# Patient Record
Sex: Female | Born: 1995 | Race: White | Hispanic: No | Marital: Married | Smoking: Never smoker
Health system: Southern US, Community
[De-identification: ages and names within clinical notes are randomized; demographics above are authoritative.]

## PROBLEM LIST (undated history)

## (undated) DIAGNOSIS — Q893 Situs inversus: Secondary | ICD-10-CM

## (undated) DIAGNOSIS — T7840XA Allergy, unspecified, initial encounter: Secondary | ICD-10-CM

## (undated) DIAGNOSIS — Z23 Encounter for immunization: Secondary | ICD-10-CM

## (undated) HISTORY — DX: Situs inversus: Q89.3

## (undated) HISTORY — DX: Allergy, unspecified, initial encounter: T78.40XA

## (undated) HISTORY — DX: Encounter for immunization: Z23

## (undated) HISTORY — PX: WISDOM TOOTH EXTRACTION: SHX21

## (undated) HISTORY — PX: TONSILLECTOMY AND ADENOIDECTOMY: SHX28

---
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
## 2016-03-19 IMAGING — US US ABDOMEN COMPLETE
1 series · 14 of 25 positions shown · non-contrast
Comparison: None.

CLINICAL DATA: Abdominal pain and nausea for 3 days.

EXAM:
ULTRASOUND ABDOMEN COMPLETE

[Series 1: us abdomen complete · 0.27mm/px · 14 of 158 slices shown]
[im 1/158]
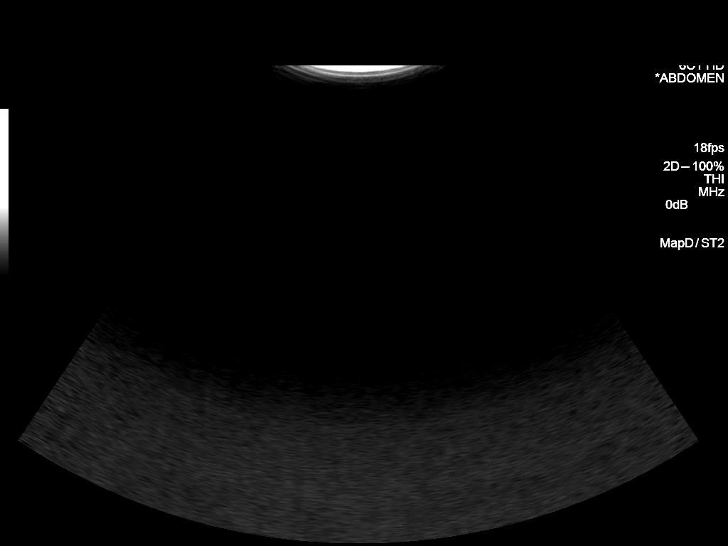
[im 14/158]
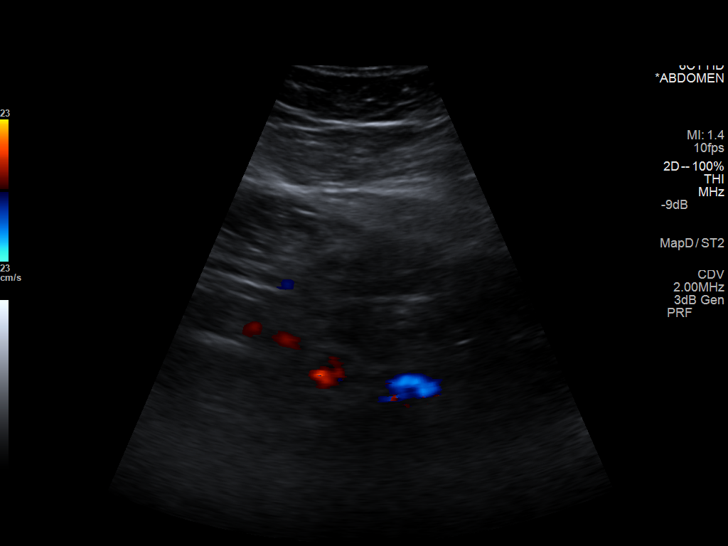
[im 27/158]
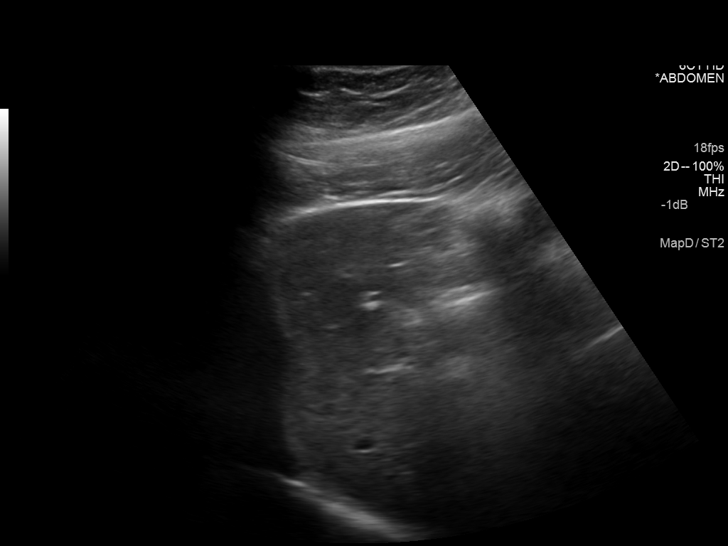
[im 40/158]
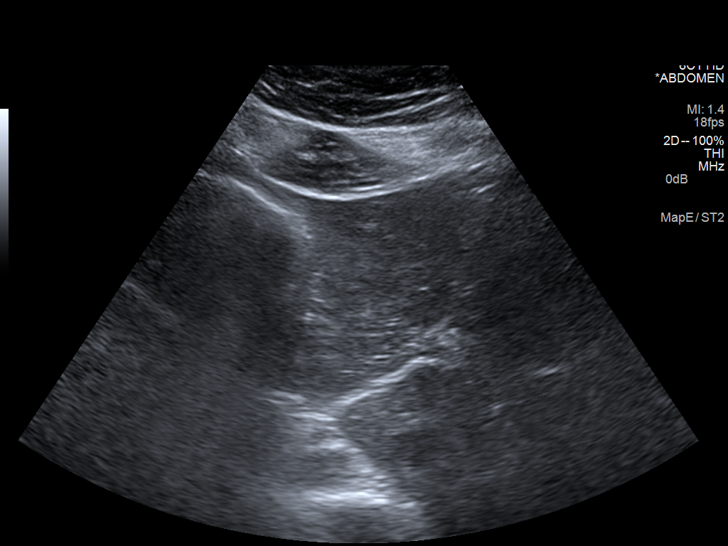
[im 53/158]
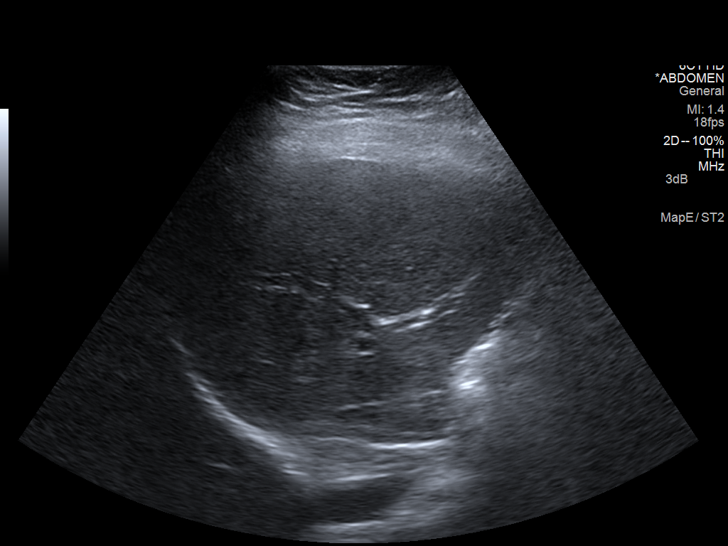
[im 59/158]
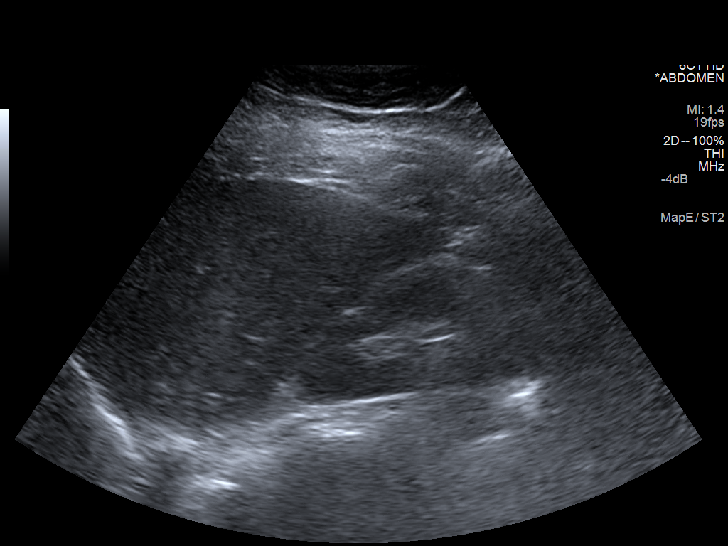
[im 72/158]
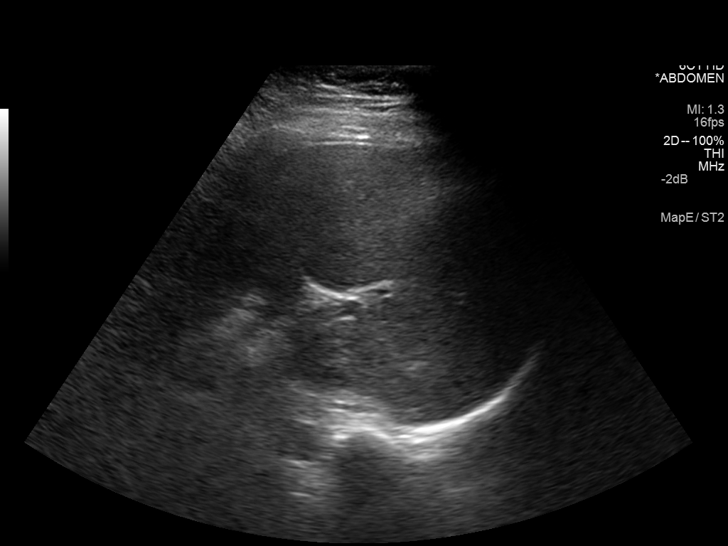
[im 86/158]
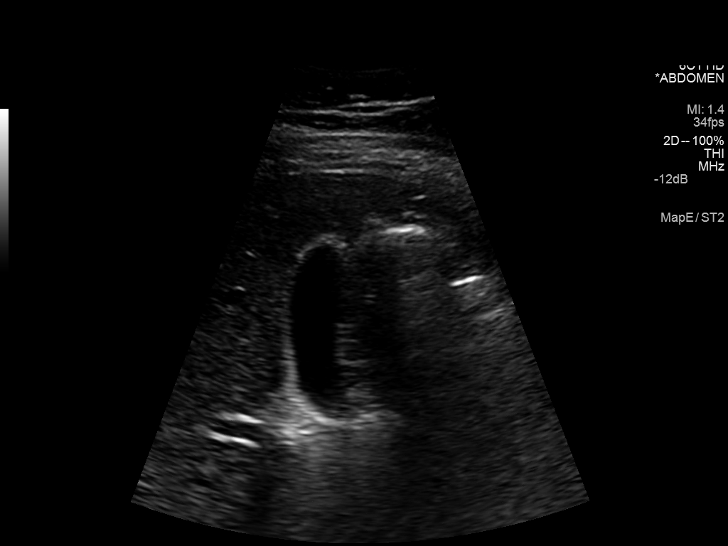
[im 99/158]
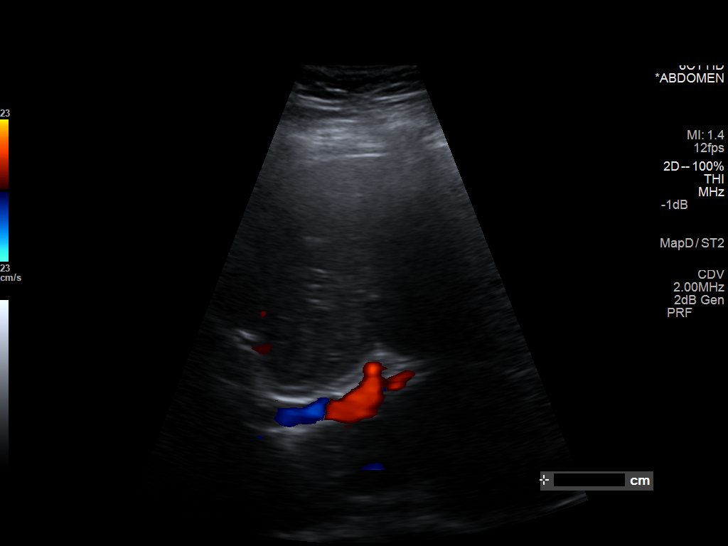
[im 105/158]
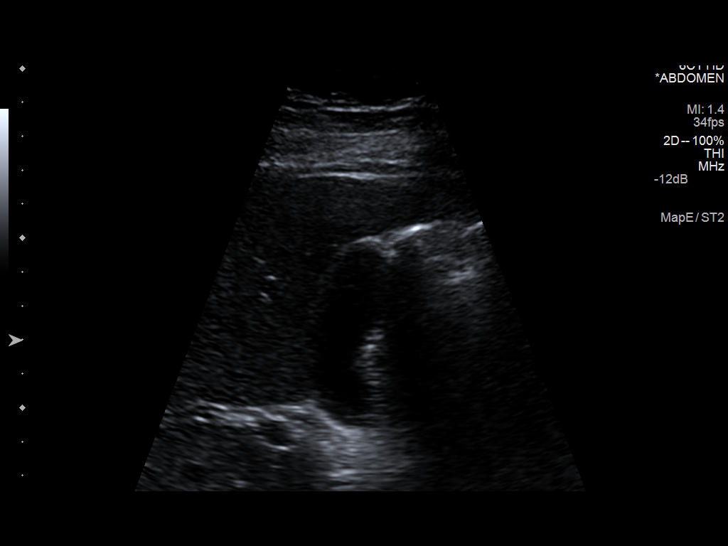
[im 118/158]
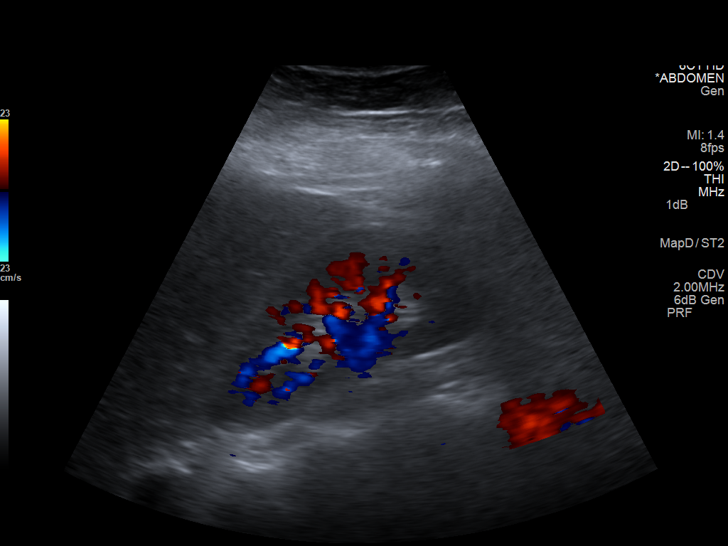
[im 131/158]
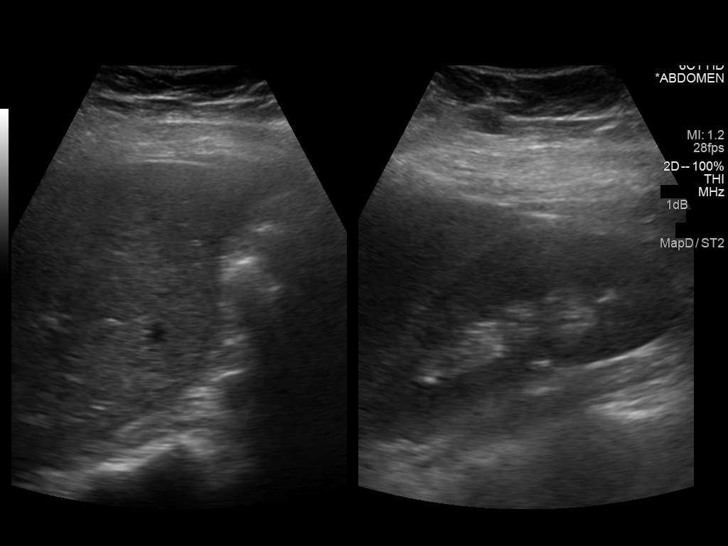
[im 144/158]
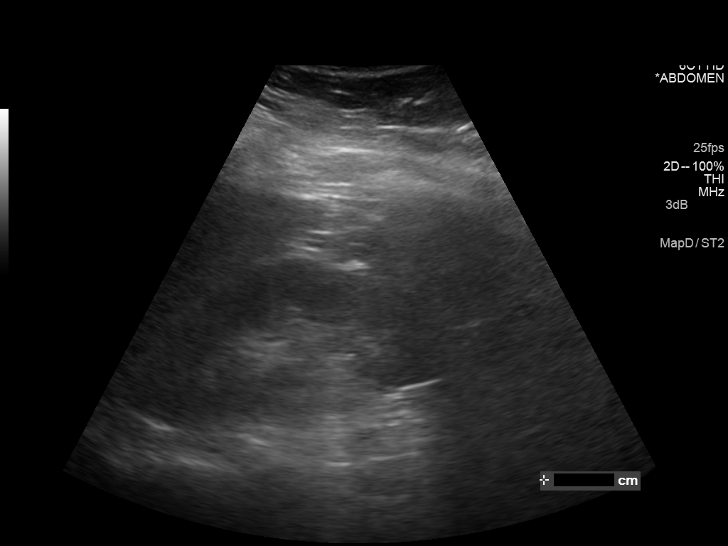
[im 158/158]
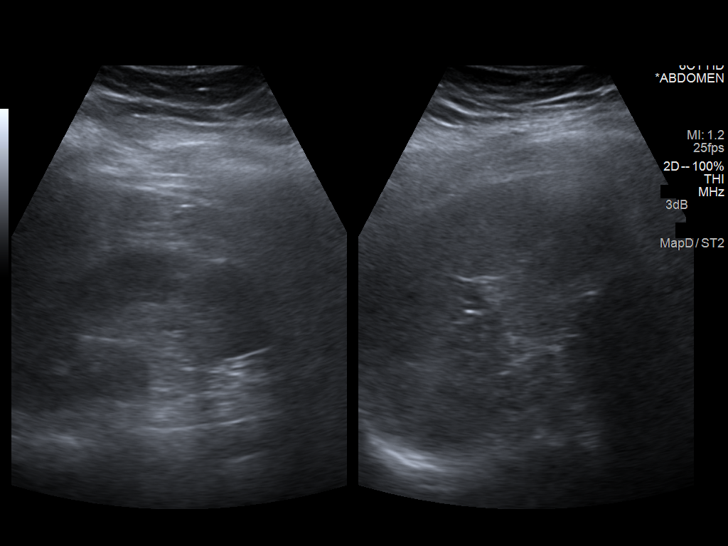

[14 of 25 positions shown; findings below may reference images not displayed]

FINDINGS: Gallbladder: No gallstones or wall thickening visualized. No
sonographic Murphy sign noted.

Common bile duct: Diameter: 2.9 mm

Liver: Normal echogenicity without focal lesion or biliary
dilatation.

IVC: Normal caliber.

Pancreas: Not visualized due to body habitus and poor sonographic
window.

Spleen: Normal size.  No focal lesions.

Right Kidney: Length: 10.2 cm. Normal renal cortical thickness and
echogenicity without focal lesions or hydronephrosis.

Left Kidney: Length: 10.7 cm. Normal renal cortical thickness and
echogenicity without focal lesions or hydronephrosis.

Abdominal aorta: Normal caliber

Other findings: Situs inversus totalis
IMPRESSION: Unremarkable abdominal ultrasound examination. Poor visualization of
the pancreas.

## 2016-04-24 LAB — HM HIV SCREENING LAB: HM HIV Screening: NEGATIVE

## 2018-11-07 ENCOUNTER — Other Ambulatory Visit: Payer: Self-pay

## 2018-11-07 ENCOUNTER — Encounter: Payer: Self-pay | Admitting: Physician Assistant

## 2018-11-07 ENCOUNTER — Ambulatory Visit (LOCAL_COMMUNITY_HEALTH_CENTER): Payer: 59 | Admitting: Physician Assistant

## 2018-11-07 ENCOUNTER — Ambulatory Visit: Payer: Self-pay | Admitting: Obstetrics and Gynecology

## 2018-11-07 VITALS — BP 144/88 | Ht 64.0 in | Wt 273.0 lb

## 2018-11-07 DIAGNOSIS — Z308 Encounter for other contraceptive management: Secondary | ICD-10-CM | POA: Diagnosis not present

## 2018-11-07 DIAGNOSIS — Z3046 Encounter for surveillance of implantable subdermal contraceptive: Secondary | ICD-10-CM | POA: Diagnosis not present

## 2018-11-07 DIAGNOSIS — Z3009 Encounter for other general counseling and advice on contraception: Secondary | ICD-10-CM | POA: Diagnosis not present

## 2018-11-07 NOTE — Progress Notes (Signed)
Pt here for Nexplanon removal as it is due for removal. Pt reports it was placed at [facility] 11/13/15. Pt without issues with Nexplanon but desires to use condoms as BCM at this time. Pt reports using condoms with all sex in the last 7 days.Ronny Bacon, RN

## 2018-11-07 NOTE — Progress Notes (Signed)
[place] problem visit  [place] Department  Subjective:  Katherine Rivera is a 23 y.o. being seen today for  Nexplanon removal.  Chief Complaint  Patient presents with  . Contraception    here for Nexplanon removal    HPI  Patient into clinic for Nexplanon removal.  States that she had placement at [facility] in 2017.  Desires to use condoms as BCM after removal.  Reports that last PE was about 1 year ago and last pap at [facility] was 2018.    Does the patient have a current or past history of drug use? No   No components found for: HCV]   Health Maintenance Due  Topic Date Due  . HIV Screening  06/29/2010  . TETANUS/TDAP  06/29/2014  . PAP-Cervical Cytology Screening  06/28/2016  . PAP SMEAR-Modifier  06/28/2016  . INFLUENZA VACCINE  08/20/2018    Review of Systems  All other systems reviewed and are negative.   The following portions of the patient's history were reviewed and updated as appropriate: allergies, current medications, past family history, past medical history, past social history, past surgical history and problem list. Problem list updated.   See flowsheet for other program required questions.  Objective:   Vitals:   11/07/18 0914  BP: (!) 144/88  Weight: 273 lb (123.8 kg)  Height: 5\' 4"  (1.626 m)    Physical Exam Vitals signs reviewed.  Constitutional:      General: She is not in acute distress.    Appearance: Normal appearance.  HENT:     Head: Normocephalic and atraumatic.  Pulmonary:     Effort: Pulmonary effort is normal.  Musculoskeletal:     Comments: L upper arm with Nexplanon palpable.  Neurological:     Mental Status: She is alert and oriented to person, place, and time.  Psychiatric:        Mood and Affect: Mood normal.        Behavior: Behavior normal.        Thought Content: Thought content normal.        Judgment: Judgment normal.       Assessment and Plan:  Katherine Rivera is a 23 y.o. female  presenting to the [facility] Department for a Women's Health problem visit  1. Encounter for counseling regarding contraception Counseled patient re:  BCM options- risks, benefits, and SE.   Counseled patient that she can RTC if she changes her mind re: hormonal BCM. Enc to follow up with PCP re:  Elevated BP.  2. Nexplanon removal Nexplanon Removal Patient identified, informed consent performed, consent signed.   Appropriate time out taken. Nexplanon site identified.  Area prepped in usual sterile fashon. 2 ml of 1% lidocaine with Epinephrine was used to anesthetize the area at the distal end of the implant and along implant site. A small stab incision was made right beside the implant on the distal portion.  The Nexplanon rod was grasped manually and removed without difficulty.  There was minimal blood loss. There were no complications.  Steri-strips were applied over the small incision.  A pressure bandage was applied to reduce any bruising.  The patient tolerated the procedure well and was given post procedure instructions.   Nexplanon:   Counseled patient to take OTC analgesic starting as soon as lidocaine starts to wear off and take regularly for at least 48 hr to decrease discomfort.  Specifically to take with food or milk to decrease  stomach upset and for IB 600 mg (3 tablets) every 6 hrs; IB 800 mg (4 tablets) every 8 hrs; or Aleve 2 tablets every 12 hrs.  3. Encounter for other contraceptive management Counseled patient re:  Using condoms with all sex for STD and pregnancy prevention. Counseled that she can also use OTC spermicides with condoms for better effectiveness.      No follow-ups on file.  No future appointments.  Jerene Dilling, PA

## 2019-01-19 ENCOUNTER — Telehealth: Payer: Self-pay | Admitting: Obstetrics and Gynecology

## 2019-01-19 NOTE — Telephone Encounter (Signed)
1/11 at 130 with ABC for nexplanon

## 2019-01-24 NOTE — Telephone Encounter (Signed)
Noted. Will order to arrive by apt date/time. 

## 2019-01-30 ENCOUNTER — Other Ambulatory Visit: Payer: Self-pay

## 2019-01-30 ENCOUNTER — Ambulatory Visit (INDEPENDENT_AMBULATORY_CARE_PROVIDER_SITE_OTHER): Payer: 59 | Admitting: Obstetrics and Gynecology

## 2019-01-30 ENCOUNTER — Other Ambulatory Visit (HOSPITAL_COMMUNITY)
Admission: RE | Admit: 2019-01-30 | Discharge: 2019-01-30 | Disposition: A | Discharge status: Home / Self Care | Payer: 59 | Source: Ambulatory Visit | Attending: Obstetrics and Gynecology | Admitting: Obstetrics and Gynecology

## 2019-01-30 ENCOUNTER — Encounter: Payer: Self-pay | Admitting: Obstetrics and Gynecology

## 2019-01-30 VITALS — BP 110/90 | Ht 67.0 in | Wt 278.0 lb

## 2019-01-30 DIAGNOSIS — Z124 Encounter for screening for malignant neoplasm of cervix: Secondary | ICD-10-CM | POA: Insufficient documentation

## 2019-01-30 DIAGNOSIS — Z30017 Encounter for initial prescription of implantable subdermal contraceptive: Secondary | ICD-10-CM

## 2019-01-30 DIAGNOSIS — Z01419 Encounter for gynecological examination (general) (routine) without abnormal findings: Secondary | ICD-10-CM

## 2019-01-30 DIAGNOSIS — Z113 Encounter for screening for infections with a predominantly sexual mode of transmission: Secondary | ICD-10-CM

## 2019-01-30 MED ORDER — ETONOGESTREL 68 MG ~~LOC~~ IMPL: 68.0000 mg | DRUG_IMPLANT | Freq: Once | SUBCUTANEOUS | Status: DC

## 2019-01-30 NOTE — Telephone Encounter (Signed)
Nexplanon reserved for this patient.

## 2019-01-30 NOTE — Patient Instructions (Signed)
I value your feedback and entrusting us with your care. If you get a [place] patient survey, I would appreciate you taking the time to let us know about your experience today. Thank you!  As of December 29, 2018, your lab results will be released to your MyChart immediately, before I even have a chance to see them. Please give me time to review them and contact you if there are any abnormalities. Thank you for your patience.  

## 2019-01-30 NOTE — Progress Notes (Signed)
PCP:  Patient, No Pcp Per   Chief Complaint  Patient presents with  . Gynecologic Exam    std testing  . Contraception    Nexplanon     HPI:      Ms. Katherine Rivera is a 24 y.o. No obstetric history on file. who LMP was Patient's last menstrual period was 01/21/2019 (exact date)., presents today for her NP annual examination.  Her menses are regular every 28-30 days, lasting 8-9 days.  Dysmenorrhea mild, improved with NSAIDs. She does not have intermenstrual bleeding.  Sex activity: single partner, contraception - condoms sometimes Would like new nexplanon. Usually amenorrheic with it. Prior nexplanon removed 10/20  Last Pap: last yr, normal.  Hx of STDs: GC in her throat in past  There is no FH of breast cancer. There is no FH of ovarian cancer. The patient does do self-breast exams.  Tobacco use: The patient denies current or previous tobacco use. Alcohol use: few times wkly No drug use.  Exercise: moderately active  She does not get adequate calcium and Vitamin D in her diet. Gardasil completed.   Past Medical History:  Diagnosis Date  . Situs inversus   . Vaccine for human papilloma virus (HPV) types 6, 11, 16, and 18 administered     Past Surgical History:  Procedure Laterality Date  . TONSILLECTOMY AND ADENOIDECTOMY      Family History  Problem Relation Age of Onset  . Cervical cancer Maternal Aunt        early 57s  . Skin cancer Paternal Grandfather   . Breast cancer Neg Hx     Social History   Socioeconomic History  . Marital status: Single    Spouse name: Not on file  . Number of children: Not on file  . Years of education: Not on file  . Highest education level: Not on file  Occupational History  . Not on file  Tobacco Use  . Smoking status: Never Smoker  . Smokeless tobacco: Never Used  Substance and Sexual Activity  . Alcohol use: Yes  . Drug use: Never  . Sexual activity: Yes    Birth control/protection: None, Condom  Other Topics  Concern  . Not on file  Social History Narrative  . Not on file   Social Determinants of Health   Financial Resource Strain:   . Difficulty of Paying Living Expenses: Not on file  Food Insecurity:   . Worried About Charity fundraiser in the Last Year: Not on file  . Ran Out of Food in the Last Year: Not on file  Transportation Needs:   . Lack of Transportation (Medical): Not on file  . Lack of Transportation (Non-Medical): Not on file  Physical Activity:   . Days of Exercise per Week: Not on file  . Minutes of Exercise per Session: Not on file  Stress:   . Feeling of Stress : Not on file  Social Connections:   . Frequency of Communication with Friends and Family: Not on file  . Frequency of Social Gatherings with Friends and Family: Not on file  . Attends Religious Services: Not on file  . Active Member of Clubs or Organizations: Not on file  . Attends Archivist Meetings: Not on file  . Marital Status: Not on file  Intimate Partner Violence:   . Fear of Current or Ex-Partner: Not on file  . Emotionally Abused: Not on file  . Physically Abused: Not on file  .  Sexually Abused: Not on file    No current outpatient medications on file.  Current Facility-Administered Medications:  .  etonogestrel (NEXPLANON) implant 68 mg, 68 mg, Subdermal, Once, Gilford Lardizabal B, PA-C     ROS:  Review of Systems  Constitutional: Negative for fatigue, fever and unexpected weight change.  Respiratory: Negative for cough, shortness of breath and wheezing.   Cardiovascular: Negative for chest pain, palpitations and leg swelling.  Gastrointestinal: Negative for blood in stool, constipation, diarrhea, nausea and vomiting.  Endocrine: Negative for cold intolerance, heat intolerance and polyuria.  Genitourinary: Negative for dyspareunia, dysuria, flank pain, frequency, genital sores, hematuria, menstrual problem, pelvic pain, urgency, vaginal bleeding, vaginal discharge and vaginal  pain.  Musculoskeletal: Negative for back pain, joint swelling and myalgias.  Skin: Negative for rash.  Neurological: Negative for dizziness, syncope, light-headedness, numbness and headaches.  Hematological: Negative for adenopathy.  Psychiatric/Behavioral: Positive for agitation. Negative for confusion, sleep disturbance and suicidal ideas. The patient is not nervous/anxious.    BREAST: No symptoms   Objective: BP 110/90   Ht 5\' 7"  (1.702 m)   Wt 278 lb (126.1 kg)   LMP 01/21/2019 (Exact Date)   BMI 43.54 kg/m    Physical Exam Constitutional:      Appearance: She is well-developed.  Genitourinary:     Vulva, vagina, cervix, uterus, right adnexa and left adnexa normal.     No vulval lesion or tenderness noted.     No vaginal discharge, erythema or tenderness.     No cervical polyp.     Uterus is not enlarged or tender.     No right or left adnexal mass present.     Right adnexa not tender.     Left adnexa not tender.  Neck:     Thyroid: No thyromegaly.  Cardiovascular:     Rate and Rhythm: Normal rate and regular rhythm.     Heart sounds: Normal heart sounds. No murmur.  Pulmonary:     Effort: Pulmonary effort is normal.     Breath sounds: Normal breath sounds.  Chest:     Breasts:        Right: No mass, nipple discharge, skin change or tenderness.        Left: No mass, nipple discharge, skin change or tenderness.  Abdominal:     Palpations: Abdomen is soft.     Tenderness: There is no abdominal tenderness. There is no guarding.  Musculoskeletal:        General: Normal range of motion.     Cervical back: Normal range of motion.  Neurological:     General: No focal deficit present.     Mental Status: She is alert and oriented to person, place, and time.     Cranial Nerves: No cranial nerve deficit.  Skin:    General: Skin is warm and dry.  Psychiatric:        Mood and Affect: Mood normal.        Behavior: Behavior normal.        Thought Content: Thought  content normal.        Judgment: Judgment normal.  Vitals reviewed.    Nexplanon Insertion  Patient given informed consent, signed copy in the chart, time out was performed.  Appropriate time out taken.  Patient's LEFT arm was prepped and draped in the usual sterile fashion. The ruler used to measure and mark insertion area.  Pt was prepped with betadine swab and then injected with 1.5 cc of 2%  lidocaine with epinephrine. Nexplanon removed form packaging,  Device confirmed in needle, then inserted full length of needle and withdrawn per handbook instructions.  Pt insertion site covered with steri-strip and a bandage.   Minimal blood loss.  Pt tolerated the procedure well.  Assessment/Plan: Encounter for annual routine gynecological examination  Cervical cancer screening - Plan: [facility] PAP  Screening for STD (sexually transmitted disease) - Plan: [facility] PAP  Nexplanon insertion - Plan: etonogestrel (NEXPLANON) implant 68 mg  Meds ordered this encounter  Medications  . etonogestrel (NEXPLANON) implant 68 mg             GYN counsel adequate intake of calcium and vitamin D, diet and exercise  She was told to remove the dressing in 12-24 hours, to keep the incision area dry for 24 hours and to remove the Steristrip in 2-3  days.  Notify us if any signs of tenderness, redness, pain, or fevers develop.       F/U  Return in about 1 year (around 01/30/2020).  Akirra Lacerda B. Jezabella Schriever, PA-C 01/30/2019 2:43 PM

## 2019-02-01 LAB — CYTOLOGY - PAP
Chlamydia: NEGATIVE
Comment: NEGATIVE
Comment: NORMAL
Diagnosis: NEGATIVE
Neisseria Gonorrhea: NEGATIVE

## 2019-04-03 NOTE — Telephone Encounter (Signed)
Nexplanon rcvd/charged 01/30/2019

## 2019-04-17 DIAGNOSIS — H5213 Myopia, bilateral: Secondary | ICD-10-CM | POA: Diagnosis not present

## 2019-05-10 ENCOUNTER — Ambulatory Visit: Payer: Self-pay | Admitting: Adult Health

## 2019-05-29 ENCOUNTER — Encounter: Payer: Self-pay | Admitting: Adult Health

## 2019-05-29 ENCOUNTER — Other Ambulatory Visit: Payer: Self-pay

## 2019-05-29 ENCOUNTER — Ambulatory Visit: Payer: 59 | Admitting: Adult Health

## 2019-05-29 VITALS — BP 110/66 | HR 118 | Temp 97.1°F | Resp 16 | Ht 67.0 in | Wt 279.6 lb

## 2019-05-29 DIAGNOSIS — Z1329 Encounter for screening for other suspected endocrine disorder: Secondary | ICD-10-CM | POA: Diagnosis not present

## 2019-05-29 DIAGNOSIS — Z0000 Encounter for general adult medical examination without abnormal findings: Secondary | ICD-10-CM

## 2019-05-29 DIAGNOSIS — L989 Disorder of the skin and subcutaneous tissue, unspecified: Secondary | ICD-10-CM

## 2019-05-29 DIAGNOSIS — H66001 Acute suppurative otitis media without spontaneous rupture of ear drum, right ear: Secondary | ICD-10-CM | POA: Diagnosis not present

## 2019-05-29 DIAGNOSIS — Z13220 Encounter for screening for lipoid disorders: Secondary | ICD-10-CM

## 2019-05-29 DIAGNOSIS — Z6841 Body Mass Index (BMI) 40.0 and over, adult: Secondary | ICD-10-CM | POA: Diagnosis not present

## 2019-05-29 DIAGNOSIS — Q893 Situs inversus: Secondary | ICD-10-CM | POA: Insufficient documentation

## 2019-05-29 DIAGNOSIS — E559 Vitamin D deficiency, unspecified: Secondary | ICD-10-CM | POA: Diagnosis not present

## 2019-05-29 DIAGNOSIS — N926 Irregular menstruation, unspecified: Secondary | ICD-10-CM | POA: Diagnosis not present

## 2019-05-29 LAB — POCT URINALYSIS DIPSTICK
Blood, UA: NEGATIVE
Glucose, UA: NEGATIVE
Ketones, UA: NEGATIVE
Leukocytes, UA: NEGATIVE
Nitrite, UA: NEGATIVE
Protein, UA: POSITIVE — AB
Spec Grav, UA: 1.025 (ref 1.010–1.025)
Urobilinogen, UA: 0.2 E.U./dL
pH, UA: 5 (ref 5.0–8.0)

## 2019-05-29 MED ORDER — AMOXICILLIN 875 MG PO TABS: 875.0000 mg | ORAL_TABLET | Freq: Two times a day (BID) | ORAL | 0 refills | Status: DC

## 2019-05-29 MED ORDER — FLUTICASONE PROPIONATE 50 MCG/ACT NA SUSP: 2.0000 | Freq: Every day | NASAL | 3 refills | Status: DC

## 2019-05-29 NOTE — Progress Notes (Signed)
New patient visit   Patient: Katherine Rivera   DOB: 31-Mar-1995   24 y.o. Female  MRN: [id number] Visit Date: 05/29/2019  Today's healthcare provider: Marcille Buffy, FNP   Clint Bolder as a scribe for [place], FNP.,have documented all relevant documentation on the behalf of Katherine Hampshire Damire Remedios, FNP,as directed by  Marcille Buffy, FNP while in the presence of Marcille Buffy, [place]. Chief Complaint  Patient presents with  . New Patient (Initial Visit)   Subjective    Katherine Rivera is a 24 y.o. female who presents today as a new patient to establish care.  HPI  Patient comes to office today to address concerns of what she believes to be a lump on the right side of her neck that has been present for the past 4 months. Patient denies pain or tenderness and states that when she touches area the "lump" appears to move.   She has a history of multiple ear infections in past, she does have nasal allergy symptoms the past month, started Zyrtec but is not consistent with taking it.  Denies any ear drainage other than increased cerumen in right ear.   Patient reports that she is working on improving diet, she is not actively exercising. Patient reports that she works night shift and during the week days her sleep patterns are poor and she averages between 3-4hrs a night, patient reports on weekends she sleeps 12+hrs.  Last Reported Pap Smear [facility]: 01/30/19- Negative. Nexplanon in January.   She feels well. She is RN in ICU.  Patient  denies any fever, body aches,chills, rash, chest pain, shortness of breath, nausea, vomiting, or diarrhea.  Denies dizziness, lightheadedness, pre syncopal or syncopal episodes.   She is married, sexually active. Had GC/ trich tested at gynecologist in January was negative. Desires blood testing now that is available.    Past Medical History:  Diagnosis Date  . Allergy   . Situs  inversus   . Vaccine for human papilloma virus (HPV) types 6, 11, 16, and 18 administered    Past Surgical History:  Procedure Laterality Date  . TONSILLECTOMY AND ADENOIDECTOMY    . WISDOM TOOTH EXTRACTION     Family Status  Relation Name Status  . [employer]  . PGF  Alive  . Mother  (Not Specified)  . Father  (Not Specified)  . Ethlyn Daniels  (Not Specified)  . MGM  (Not Specified)  . PGM  (Not Specified)  . Neg Hx  (Not Specified)   Family History  Problem Relation Age of Onset  . Cervical cancer Maternal Aunt        early 35s  . Skin cancer Paternal Grandfather   . Congestive Heart Failure Paternal Grandfather   . Sleep apnea Paternal Grandfather   . Anxiety disorder Mother   . Asthma Mother   . Depression Mother   . Glaucoma Mother   . Sleep apnea Father   . Heart murmur Father   . Hypertension Father   . Lupus Paternal Aunt   . Drug abuse Maternal Grandmother   . Hypertension Paternal Grandmother   . Breast cancer Neg Hx    Social History   Socioeconomic History  . Marital status: Single    Spouse name: Not on file  . Number of children: Not on file  . Years of education: Not on file  . Highest education level: Not on file  Occupational History  .  Not on file  Tobacco Use  . Smoking status: Never Smoker  . Smokeless tobacco: Never Used  Substance and Sexual Activity  . Alcohol use: Yes  . Drug use: Never  . Sexual activity: Yes    Birth control/protection: None, Condom  Other Topics Concern  . Not on file  Social History Narrative  . Not on file   Social Determinants of Health   Financial Resource Strain:   . Difficulty of Paying Living Expenses:   Food Insecurity:   . Worried About Charity fundraiser in the Last Year:   . Arboriculturist in the Last Year:   Transportation Needs:   . Film/video editor (Medical):   Marland Kitchen Lack of Transportation (Non-Medical):   Physical Activity:   . Days of Exercise per Week:   . Minutes of Exercise per  Session:   Stress:   . Feeling of Stress :   Social Connections:   . Frequency of Communication with Friends and Family:   . Frequency of Social Gatherings with Friends and Family:   . Attends Religious Services:   . Active Member of Clubs or Organizations:   . Attends Archivist Meetings:   Marland Kitchen Marital Status:    No outpatient medications prior to visit.   Facility-Administered Medications Prior to Visit  Medication Dose Route Frequency Provider  . etonogestrel (NEXPLANON) implant 68 mg  68 mg Subdermal Once Copland, Alicia B, PA-C   Allergies  Allergen Reactions  . Latex Rash    Immunization History  Administered Date(s) Administered  . Influenza-Unspecified 11/14/2018    Health Maintenance  Topic Date Due  . COVID-19 Vaccine (1) Never done  . TETANUS/TDAP  Never done  . INFLUENZA VACCINE  08/20/2019  . PAP-Cervical Cytology Screening  01/29/2022  . PAP SMEAR-Modifier  01/29/2022  . HIV Screening  Completed    Patient Care Team: Clytee Heinrich, Kelby Aline, FNP as PCP - General (Family Medicine)  Review of Systems  Constitutional: Negative.   HENT: Positive for ear discharge (increased wax coming out of right ear and fullness ), postnasal drip and rhinorrhea. Negative for ear pain, facial swelling, hearing loss, mouth sores, nosebleeds, sinus pressure, sinus pain, sneezing, sore throat, tinnitus, trouble swallowing and voice change.   Respiratory: Negative for apnea, cough, choking, chest tightness, shortness of breath, wheezing and stridor.   Cardiovascular: Negative for chest pain, palpitations and leg swelling.  Musculoskeletal: Negative.   Skin: Negative.   Hematological: Positive for adenopathy (right posterior lymph node  ).  Psychiatric/Behavioral: Negative.   All other systems reviewed and are negative.   Last CBC Lab Results  Component Value Date   WBC 10.1 05/23/2014   HGB 15.2 05/23/2014   HCT 44.8 05/23/2014   MCV 86.7 05/23/2014   MCH 29.5  05/23/2014   RDW 12.7 05/23/2014   PLT 217 [id number]   Last metabolic panel Lab Results  Component Value Date   GLUCOSE 103 (H) 05/23/2014   NA 143 05/23/2014   K 3.6 05/23/2014   CL 110 05/23/2014   CO2 24 05/23/2014   BUN 12 05/23/2014   CREATININE 0.76 05/23/2014   GFRNONAA >60 05/23/2014   GFRAA >60 05/23/2014   CALCIUM 9.6 05/23/2014   PROT 7.7 05/23/2014   ALBUMIN 3.8 05/23/2014   BILITOT 0.4 05/23/2014   ALKPHOS 58 05/23/2014   AST 19 05/23/2014   ALT 15 05/23/2014   ANIONGAP 9 05/23/2014   Last lipids No results found for: CHOL, HDL, LDLCALC,  LDLDIRECT, TRIG, CHOLHDL Last hemoglobin A1c No results found for: HGBA1C Last thyroid functions No results found for: TSH, T3TOTAL, T4TOTAL, THYROIDAB Last vitamin D No results found for: 25OHVITD2, 25OHVITD3, VD25OH Last vitamin B12 and Folate No results found for: VITAMINB12, FOLATE    Objective    BP 110/66   Pulse (!) 118   Temp (!) 97.1 F (36.2 C) (Oral)   Resp 16   Ht 5\' 7"  (1.702 m)   Wt 279 lb 9.6 oz (126.8 kg)   SpO2 99%   BMI 43.79 kg/m  Physical Exam Constitutional:      General: She is not in acute distress.    Appearance: She is obese. She is not ill-appearing, toxic-appearing or diaphoretic.     Comments: Patient appers well, not sickly. Speaking in complete sentences. Patient moves on and off of exam table and in room without difficulty. Gait is normal in hall and in room. Patient is oriented to person place time and situation. Patient answers questions appropriately and engages eye contact and verbal dialect with provider.    HENT:     Head: Normocephalic and atraumatic.     Jaw: There is normal jaw occlusion.     Comments:  Cobblestoning posterior pharynx; bilateral allergic shiners; left Tympanic membrane  air fluid level clear fluid ;right tympanic membrane with slight bulging, yellow brown fluid behind dull tympanic membrane  bilateral nasal turbinates mild edema erythema clear discharge;        Right Ear: Hearing and external ear normal. A middle ear effusion is present. There is no impacted cerumen. Tympanic membrane is erythematous and bulging. Tympanic membrane is not perforated.     Left Ear: Hearing and external ear normal. A middle ear effusion is present. There is no impacted cerumen. Tympanic membrane is not perforated, erythematous or bulging.     Nose: Rhinorrhea present. No congestion.     Mouth/Throat:     Mouth: Mucous membranes are moist.     Pharynx: No oropharyngeal exudate or posterior oropharyngeal erythema.  Eyes:     General: No scleral icterus.       Right eye: No discharge.        Left eye: No discharge.     Extraocular Movements: Extraocular movements intact.     Conjunctiva/sclera: Conjunctivae normal.     Pupils: Pupils are equal, round, and reactive to light.  Neck:     Vascular: No carotid bruit.  Cardiovascular:     Rate and Rhythm: Normal rate and regular rhythm.     Pulses: Normal pulses.     Heart sounds: Normal heart sounds.  Pulmonary:     Effort: Pulmonary effort is normal. No respiratory distress.     Breath sounds: Normal breath sounds. No stridor. No wheezing, rhonchi or rales.  Chest:     Chest wall: No tenderness.  Abdominal:     General: There is no distension.     Palpations: Abdomen is soft. There is no mass.     Tenderness: There is no abdominal tenderness. There is no right CVA tenderness, left CVA tenderness, guarding or rebound.     Hernia: No hernia is present.  Musculoskeletal:        General: No swelling, tenderness, deformity or signs of injury. Normal range of motion.     Cervical back: Normal range of motion and neck supple. No rigidity or tenderness.     Right lower leg: No edema.     Left lower leg: No edema.  Skin:    General: Skin is warm and dry.     Capillary Refill: Capillary refill takes less than 2 seconds.     Findings: Lesion (left nare, very tiny flesh colored bump. ) present.  Neurological:      Mental Status: She is alert and oriented to person, place, and time.     Cranial Nerves: No cranial nerve deficit.     Sensory: No sensory deficit.     Motor: No weakness.     Coordination: Coordination normal.     Gait: Gait normal.     Deep Tendon Reflexes: Reflexes normal.  Psychiatric:        Mood and Affect: Mood normal.        Behavior: Behavior normal.        Thought Content: Thought content normal.        Judgment: Judgment normal.    Denies any suicidal ideations or intents or homicidal ideations or intents.  Denies any depression or anxiety.    Depression Screen PHQ 2/9 Scores 05/29/2019  PHQ - 2 Score 0  PHQ- 9 Score 6   No results found for any visits on 05/29/19.  Assessment & Plan     Encounter for routine adult health examination without abnormal findings - Plan: POCT urinalysis dipstick, CBC with Differential/Platelet, Comprehensive metabolic panel, Hepatitis panel, acute, HIV antibody (with reflex), RPR, hCG, serum, qualitative  Vitamin D insufficiency - Plan: VITAMIN D 25 Hydroxy (Vit-D Deficiency, Fractures)  Screening cholesterol level - Plan: Lipid panel  Screening for thyroid disorder - Plan: TSH  Menstrual periods, abnormal - Plan: RPR  Non-healing skin lesion of nose - Plan: Ambulatory referral to Dermatology  Non-recurrent acute suppurative otitis media of right ear without spontaneous rupture of tympanic membrane  Body mass index (BMI) of 40.1-44.9 in adult [facility])  Meds ordered this encounter  Medications  . amoxicillin (AMOXIL) 875 MG tablet    Sig: Take 1 tablet (875 mg total) by mouth 2 (two) times daily.    Dispense:  20 tablet    Refill:  0  . fluticasone (FLONASE) 50 MCG/ACT nasal spray    Sig: Place 2 sprays into both nostrils daily.    Dispense:  16 g    Refill:  3    Orders Placed This Encounter  Procedures  . CBC with Differential/Platelet  . Comprehensive metabolic panel  . Lipid panel  . TSH  . VITAMIN D 25 Hydroxy (Vit-D  Deficiency, Fractures)  . Hepatitis panel, acute  . HIV antibody (with reflex)  . RPR  . hCG, serum, qualitative  . Ambulatory referral to Dermatology    Referral Priority:   Routine    Referral Type:   Consultation    Referral Reason:   Specialty Services Required    Referred to Provider:   Jannet Mantis, MD    Requested Specialty:   Dermatology    Number of Visits Requested:   1  . POCT urinalysis dipstick   Please return in one month for lymph node recheck or if worsening at anytime/ concerns.  Call if not heard from dermatology within 2 weeks.   Return if symptoms worsen or fail to improve, for Return back to office for lab draw when you are fasting. at any time for any worsening symptoms. No follow-ups on file.     Advised patient call the office or your primary care doctor for an appointment if no improvement within 72 hours or if any symptoms change or worsen  at any time  Advised ER or urgent Care if after hours or on weekend. Call 911 for emergency symptoms at any time.Patinet verbalized understanding of all instructions given/reviewed and treatment plan and has no further questions or concerns at this time.      IWellington Hampshire Altagracia Rone, FNP, have reviewed all documentation for this visit. The documentation on 05/29/19 for the exam, diagnosis, procedures, and orders are all accurate and complete.   Marcille Buffy, [place] [phone] (phone) [phone] (fax)  [place]

## 2019-05-29 NOTE — Patient Instructions (Addendum)
Health Maintenance, Female Adopting a healthy lifestyle and getting preventive care are important in promoting health and wellness. Ask your health care provider about:  The right schedule for you to have regular tests and exams.  Things you can do on your own to prevent diseases and keep yourself healthy. What should I know about diet, weight, and exercise? Eat a healthy diet   Eat a diet that includes plenty of vegetables, fruits, low-fat dairy products, and lean protein.  Do not eat a lot of foods that are high in solid fats, added sugars, or sodium. Maintain a healthy weight Body mass index (BMI) is used to identify weight problems. It estimates body fat based on height and weight. Your health care provider can help determine your BMI and help you achieve or maintain a healthy weight. Get regular exercise Get regular exercise. This is one of the most important things you can do for your health. Most adults should:  Exercise for at least 150 minutes each week. The exercise should increase your heart rate and make you sweat (moderate-intensity exercise).  Do strengthening exercises at least twice a week. This is in addition to the moderate-intensity exercise.  Spend less time sitting. Even light physical activity can be beneficial. Watch cholesterol and blood lipids Have your blood tested for lipids and cholesterol at 24 years of age, then have this test every 5 years. Have your cholesterol levels checked more often if:  Your lipid or cholesterol levels are high.  You are older than 24 years of age.  You are at high risk for heart disease. What should I know about cancer screening? Depending on your health history and family history, you may need to have cancer screening at various ages. This may include screening for:  Breast cancer.  Cervical cancer.  Colorectal cancer.  Skin cancer.  Lung cancer. What should I know about heart disease, diabetes, and high blood  pressure? Blood pressure and heart disease  High blood pressure causes heart disease and increases the risk of stroke. This is more likely to develop in people who have high blood pressure readings, are of African descent, or are overweight.  Have your blood pressure checked: ? Every 3-5 years if you are 18-39 years of age. ? Every year if you are 40 years old or older. Diabetes Have regular diabetes screenings. This checks your fasting blood sugar level. Have the screening done:  Once every three years after age 40 if you are at a normal weight and have a low risk for diabetes.  More often and at a younger age if you are overweight or have a high risk for diabetes. What should I know about preventing infection? Hepatitis B If you have a higher risk for hepatitis B, you should be screened for this virus. Talk with your health care provider to find out if you are at risk for hepatitis B infection. Hepatitis C Testing is recommended for:  Everyone born from 1945 through 1965.  Anyone with known risk factors for hepatitis C. Sexually transmitted infections (STIs)  Get screened for STIs, including gonorrhea and chlamydia, if: ? You are sexually active and are younger than 24 years of age. ? You are older than 24 years of age and your health care provider tells you that you are at risk for this type of infection. ? Your sexual activity has changed since you were last screened, and you are at increased risk for chlamydia or gonorrhea. Ask your health care provider if   you are at risk.  Ask your health care provider about whether you are at high risk for HIV. Your health care provider may recommend a prescription medicine to help prevent HIV infection. If you choose to take medicine to prevent HIV, you should first get tested for HIV. You should then be tested every 3 months for as long as you are taking the medicine. Pregnancy  If you are about to stop having your period (premenopausal) and  you may become pregnant, seek counseling before you get pregnant.  Take 400 to 800 micrograms (mcg) of folic acid every day if you become pregnant.  Ask for birth control (contraception) if you want to prevent pregnancy. Osteoporosis and menopause Osteoporosis is a disease in which the bones lose minerals and strength with aging. This can result in bone fractures. If you are 85 years old or older, or if you are at risk for osteoporosis and fractures, ask your health care provider if you should:  Be screened for bone loss.  Take a calcium or vitamin D supplement to lower your risk of fractures.  Be given hormone replacement therapy (HRT) to treat symptoms of menopause. Follow these instructions at home: Lifestyle  Do not use any products that contain nicotine or tobacco, such as cigarettes, e-cigarettes, and chewing tobacco. If you need help quitting, ask your health care provider.  Do not use street drugs.  Do not share needles.  Ask your health care provider for help if you need support or information about quitting drugs. Alcohol use  Do not drink alcohol if: ? Your health care provider tells you not to drink. ? You are pregnant, may be pregnant, or are planning to become pregnant.  If you drink alcohol: ? Limit how much you use to 0-1 drink a day. ? Limit intake if you are breastfeeding.  Be aware of how much alcohol is in your drink. In the [place] one drink equals one 12 oz bottle of beer (355 mL), one 5 oz glass of wine (148 mL), or one 1 oz glass of hard liquor (44 mL). General instructions  Schedule regular health, dental, and eye exams.  Stay current with your vaccines.  Tell your health care provider if: ? You often feel depressed. ? You have ever been abused or do not feel safe at home. Summary  Adopting a healthy lifestyle and getting preventive care are important in promoting health and wellness.  Follow your health care provider's instructions about healthy  diet, exercising, and getting tested or screened for diseases.  Follow your health care provider's instructions on monitoring your cholesterol and blood pressure. This information is not intended to replace advice given to you by your health care provider. Make sure you discuss any questions you have with your health care provider. Document Revised: 12/29/2017 Document Reviewed: 12/29/2017 Elsevier Patient Education  2020 [place] Breast self-awareness is knowing how your breasts look and feel. Doing breast self-awareness is important. It allows you to catch a breast problem early while it is still small and can be treated. All women should do breast self-awareness, including women who have had breast implants. Tell your doctor if you notice a change in your breasts. What you need:  A mirror.  A well-lit room. How to do a breast self-exam A breast self-exam is one way to learn what is normal for your breasts and to check for changes. To do a breast self-exam: Look for changes  1. Take off all  the clothes above your waist. 2. Stand in front of a mirror in a room with good lighting. 3. Put your hands on your hips. 4. Push your hands down. 5. Look at your breasts and nipples in the mirror to see if one breast or nipple looks different from the other. Check to see if: ? The shape of one breast is different. ? The size of one breast is different. ? There are wrinkles, dips, and bumps in one breast and not the other. 6. Look at each breast for changes in the skin, such as: ? Redness. ? Scaly areas. 7. Look for changes in your nipples, such as: ? Liquid around the nipples. ? Bleeding. ? Dimpling. ? Redness. ? A change in where the nipples are. Feel for changes  1. Lie on your back on the floor. 2. Feel each breast. To do this, follow these steps: ? Pick a breast to feel. ? Put the arm closest to that breast above your head. ? Use your other arm to feel  the nipple area of your breast. Feel the area with the pads of your three middle fingers by making small circles with your fingers. For the first circle, press lightly. For the second circle, press harder. For the third circle, press even harder. ? Keep making circles with your fingers at the different pressures as you move down your breast. Stop when you feel your ribs. ? Move your fingers a little toward the center of your body. ? Start making circles with your fingers again, this time going up until you reach your collarbone. ? Keep making up-and-down circles until you reach your armpit. Remember to keep using the three pressures. ? Feel the other breast in the same way. 3. Sit or stand in the tub or shower. 4. With soapy water on your skin, feel each breast the same way you did in step 2 when you were lying on the floor. Write down what you find Writing down what you find can help you remember what to tell your doctor. Write down:  What is normal for each breast.  Any changes you find in each breast, including: ? The kind of changes you find. ? Whether you have pain. ? Size and location of any lumps.  When you last had your menstrual period. General tips  Check your breasts every month.  If you are breastfeeding, the best time to check your breasts is after you feed your baby or after you use a breast pump.  If you get menstrual periods, the best time to check your breasts is 5-7 days after your menstrual period is over.  With time, you will become comfortable with the self-exam, and you will begin to know if there are changes in your breasts. Contact a doctor if you:  See a change in the shape or size of your breasts or nipples.  See a change in the skin of your breast or nipples, such as red or scaly skin.  Have fluid coming from your nipples that is not normal.  Find a lump or thick area that was not there before.  Have pain in your breasts.  Have any concerns about  your breast health. Summary  Breast self-awareness includes looking for changes in your breasts, as well as feeling for changes within your breasts.  Breast self-awareness should be done in front of a mirror in a well-lit room.  You should check your breasts every month. If you get menstrual periods, the best  time to check your breasts is 5-7 days after your menstrual period is over.  Let your doctor know of any changes you see in your breasts, including changes in size, changes on the skin, pain or tenderness, or fluid from your nipples that is not normal. This information is not intended to replace advice given to you by your health care provider. Make sure you discuss any questions you have with your health care provider. Document Revised: 08/24/2017 Document Reviewed: 08/24/2017 Elsevier Patient Education  [place].  Otitis Media, Adult  Otitis media means that the middle ear is red and swollen (inflamed) and full of fluid. The condition usually goes away on its own. Follow these instructions at home:  Take over-the-counter and prescription medicines only as told by your doctor.  If you were prescribed an antibiotic medicine, take it as told by your doctor. Do not stop taking the antibiotic even if you start to feel better.  Keep all follow-up visits as told by your doctor. This is important. Contact a doctor if:  You have bleeding from your nose.  There is a lump on your neck.  You are not getting better in 5 days.  You feel worse instead of better. Get help right away if:  You have pain that is not helped with medicine.  You have swelling, redness, or pain around your ear.  You get a stiff neck.  You cannot move part of your face (paralyzed).  You notice that the bone behind your ear hurts when you touch it.  You get a very bad headache. Summary  Otitis media means that the middle ear is red, swollen, and full of fluid.  This condition usually goes  away on its own. In some cases, treatment may be needed.  If you were prescribed an antibiotic medicine, take it as told by your doctor. This information is not intended to replace advice given to you by your health care provider. Make sure you discuss any questions you have with your health care provider. Document Revised: 12/18/2016 Document Reviewed: 01/27/2016 Elsevier Patient Education  2020 [place].  Lymphadenopathy  Lymphadenopathy means that your lymph glands are swollen or larger than normal (enlarged). Lymph glands, also called lymph nodes, are collections of tissue that filter bacteria, viruses, and waste from your bloodstream. They are part of your body's disease-fighting system (immune system), which protects your body from germs. There may be different causes of lymphadenopathy, depending on where it is in your body. Some types go away on their own. Lymphadenopathy can occur anywhere that you have lymph glands, including these areas:  Neck (cervical lymphadenopathy).  Chest (mediastinal lymphadenopathy).  Lungs (hilar lymphadenopathy).  Underarms (axillary lymphadenopathy).  Groin (inguinal lymphadenopathy). When your immune system responds to germs, infection-fighting cells and fluid build up in your lymph glands. This causes some swelling and enlargement. If the lymph glands do not go back to normal after you have an infection or disease, your health care provider may do tests. These tests help to monitor your condition and find the reason why the glands are still swollen and enlarged. Follow these instructions at home:  Get plenty of rest.  Take over-the-counter and prescription medicines only as told by your health care provider. Your health care provider may recommend over-the-counter medicines for pain.  If directed, apply heat to swollen lymph glands as often as told by your health care provider. Use the heat source that your health care provider recommends,  such as a moist  heat pack or a heating pad. ? Place a towel between your skin and the heat source. ? Leave the heat on for 20-30 minutes. ? Remove the heat if your skin turns bright red. This is especially important if you are unable to feel pain, heat, or cold. You may have a greater risk of getting burned.  Check your affected lymph glands every day for changes. Check other lymph gland areas as told by your health care provider. Check for changes such as: ? More swelling. ? Sudden increase in size. ? Redness or pain. ? Hardness.  Keep all follow-up visits as told by your health care provider. This is important. Contact a health care provider if you have:  Swelling that gets worse or spreads to other areas.  Problems with breathing.  Lymph glands that: ? Are still swollen after 2 weeks. ? Have suddenly gotten bigger. ? Are red, painful, or hard.  A fever or chills.  Fatigue.  A sore throat.  Pain in your abdomen.  Weight loss.  Night sweats. Get help right away if you have:  Fluid leaking from an enlarged lymph gland.  Severe pain.  Chest pain.  Shortness of breath. Summary  Lymphadenopathy means that your lymph glands are swollen or larger than normal (enlarged).  Lymph glands (also called lymph nodes) are collections of tissue that filter bacteria, viruses, and waste from the bloodstream. They are part of your body's disease-fighting system (immune system).  Lymphadenopathy can occur anywhere that you have lymph glands.  If your enlarged and swollen lymph glands do not go back to normal after you have an infection or disease, your health care provider may do tests to monitor your condition and find the reason why the glands are still swollen and enlarged.  Check your affected lymph glands every day for changes. Check other lymph gland areas as told by your health care provider. This information is not intended to replace advice given to you by your health care  provider. Make sure you discuss any questions you have with your health care provider. Document Revised: 12/18/2016 Document Reviewed: 11/20/2016 Elsevier Patient Education  2020 [place] for [employer] Calories are units of energy. Your body needs a certain amount of calories from food to keep you going throughout the day. When you eat more calories than your body needs, your body stores the extra calories as fat. When you eat fewer calories than your body needs, your body burns fat to get the energy it needs. Calorie counting means keeping track of how many calories you eat and drink each day. Calorie counting can be helpful if you need to lose weight. If you make sure to eat fewer calories than your body needs, you should lose weight. Ask your health care provider what a healthy weight is for you. For calorie counting to work, you will need to eat the right number of calories in a day in order to lose a healthy amount of weight per week. A dietitian can help you determine how many calories you need in a day and will give you suggestions on how to reach your calorie goal.  A healthy amount of weight to lose per week is usually 1-2 lb (0.5-0.9 kg). This usually means that your daily calorie intake should be reduced by 500-750 calories.  Eating 1,200 - 1,500 calories per day can help most women lose weight.  Eating 1,500 - 1,800 calories per day can help most men lose  weight. What is my plan? My goal is to have __________ calories per day. If I have this many calories per day, I should lose around __________ pounds per week. What do I need to know about calorie counting? In order to meet your daily calorie goal, you will need to:  Find out how many calories are in each food you would like to eat. Try to do this before you eat.  Decide how much of the food you plan to eat.  Write down what you ate and how many calories it had. Doing this is called keeping a food  log. To successfully lose weight, it is important to balance calorie counting with a healthy lifestyle that includes regular activity. Aim for 150 minutes of moderate exercise (such as walking) or 75 minutes of vigorous exercise (such as running) each week. Where do I find calorie information?  The number of calories in a food can be found on a Nutrition Facts label. If a food does not have a Nutrition Facts label, try to look up the calories online or ask your dietitian for help. Remember that calories are listed per serving. If you choose to have more than one serving of a food, you will have to multiply the calories per serving by the amount of servings you plan to eat. For example, the label on a package of bread might say that a serving size is 1 slice and that there are 90 calories in a serving. If you eat 1 slice, you will have eaten 90 calories. If you eat 2 slices, you will have eaten 180 calories. How do I keep a food log? Immediately after each meal, record the following information in your food log:  What you ate. Don't forget to include toppings, sauces, and other extras on the food.  How much you ate. This can be measured in cups, ounces, or number of items.  How many calories each food and drink had.  The total number of calories in the meal. Keep your food log near you, such as in a small notebook in your pocket, or use a mobile app or website. Some programs will calculate calories for you and show you how many calories you have left for the day to meet your goal. What are some calorie counting tips?   Use your calories on foods and drinks that will fill you up and not leave you hungry: ? Some examples of foods that fill you up are nuts and nut butters, vegetables, lean proteins, and high-fiber foods like whole grains. High-fiber foods are foods with more than 5 g fiber per serving. ? Drinks such as sodas, specialty coffee drinks, alcohol, and juices have a lot of calories, yet  do not fill you up.  Eat nutritious foods and avoid empty calories. Empty calories are calories you get from foods or beverages that do not have many vitamins or protein, such as candy, sweets, and soda. It is better to have a nutritious high-calorie food (such as an avocado) than a food with few nutrients (such as a bag of chips).  Know how many calories are in the foods you eat most often. This will help you calculate calorie counts faster.  Pay attention to calories in drinks. Low-calorie drinks include water and unsweetened drinks.  Pay attention to nutrition labels for "low fat" or "fat free" foods. These foods sometimes have the same amount of calories or more calories than the full fat versions. They also often have added  sugar, starch, or salt, to make up for flavor that was removed with the fat.  Find a way of tracking calories that works for you. Get creative. Try different apps or programs if writing down calories does not work for you. What are some portion control tips?  Know how many calories are in a serving. This will help you know how many servings of a certain food you can have.  Use a measuring cup to measure serving sizes. You could also try weighing out portions on a kitchen scale. With time, you will be able to estimate serving sizes for some foods.  Take some time to put servings of different foods on your favorite plates, bowls, and cups so you know what a serving looks like.  Try not to eat straight from a bag or box. Doing this can lead to overeating. Put the amount you would like to eat in a cup or on a plate to make sure you are eating the right portion.  Use smaller plates, glasses, and bowls to prevent overeating.  Try not to multitask (for example, watch TV or use your computer) while eating. If it is time to eat, sit down at a table and enjoy your food. This will help you to know when you are full. It will also help you to be aware of what you are eating and how  much you are eating. What are tips for following this plan? Reading food labels  Check the calorie count compared to the serving size. The serving size may be smaller than what you are used to eating.  Check the source of the calories. Make sure the food you are eating is high in vitamins and protein and low in saturated and trans fats. Shopping  Read nutrition labels while you shop. This will help you make healthy decisions before you decide to purchase your food.  Make a grocery list and stick to it. Cooking  Try to cook your favorite foods in a healthier way. For example, try baking instead of frying.  Use low-fat dairy products. Meal planning  Use more fruits and vegetables. Half of your plate should be fruits and vegetables.  Include lean proteins like poultry and fish. How do I count calories when eating out?  Ask for smaller portion sizes.  Consider sharing an entree and sides instead of getting your own entree.  If you get your own entree, eat only half. Ask for a box at the beginning of your meal and put the rest of your entree in it so you are not tempted to eat it.  If calories are listed on the menu, choose the lower calorie options.  Choose dishes that include vegetables, fruits, whole grains, low-fat dairy products, and lean protein.  Choose items that are boiled, broiled, grilled, or steamed. Stay away from items that are buttered, battered, fried, or served with cream sauce. Items labeled "crispy" are usually fried, unless stated otherwise.  Choose water, low-fat milk, unsweetened iced tea, or other drinks without added sugar. If you want an alcoholic beverage, choose a lower calorie option such as a glass of wine or light beer.  Ask for dressings, sauces, and syrups on the side. These are usually high in calories, so you should limit the amount you eat.  If you want a salad, choose a garden salad and ask for grilled meats. Avoid extra toppings like bacon,  cheese, or fried items. Ask for the dressing on the side, or ask for olive  oil and vinegar or lemon to use as dressing.  Estimate how many servings of a food you are given. For example, a serving of cooked rice is  cup or about the size of half a baseball. Knowing serving sizes will help you be aware of how much food you are eating at restaurants. The list below tells you how big or small some common portion sizes are based on everyday objects: ? 1 oz--4 stacked dice. ? 3 oz--1 deck of cards. ? 1 tsp--1 die. ? 1 Tbsp-- a ping-pong ball. ? 2 Tbsp--1 ping-pong ball. ?  cup-- baseball. ? 1 cup--1 baseball. Summary  Calorie counting means keeping track of how many calories you eat and drink each day. If you eat fewer calories than your body needs, you should lose weight.  A healthy amount of weight to lose per week is usually 1-2 lb (0.5-0.9 kg). This usually means reducing your daily calorie intake by 500-750 calories.  The number of calories in a food can be found on a Nutrition Facts label. If a food does not have a Nutrition Facts label, try to look up the calories online or ask your dietitian for help.  Use your calories on foods and drinks that will fill you up, and not on foods and drinks that will leave you hungry.  Use smaller plates, glasses, and bowls to prevent overeating. This information is not intended to replace advice given to you by your health care provider. Make sure you discuss any questions you have with your health care provider. Document Revised: 09/24/2017 Document Reviewed: 12/06/2015 Elsevier Patient Education  [place] and Cholesterol Restricted Eating Plan Getting too much fat and cholesterol in your diet may cause health problems. Choosing the right foods helps keep your fat and cholesterol at normal levels. This can keep you from getting certain diseases. Your doctor may recommend an eating plan that includes:  Total fat: ______% or less  of total calories a day.  Saturated fat: ______% or less of total calories a day.  Cholesterol: less than _________mg a day.  Fiber: ______g a day. What are tips for following this plan? Meal planning  At meals, divide your plate into four equal parts: ? Fill one-half of your plate with vegetables and green salads. ? Fill one-fourth of your plate with whole grains. ? Fill one-fourth of your plate with low-fat (lean) protein foods.  Eat fish that is high in omega-3 fats at least two times a week. This includes mackerel, tuna, sardines, and salmon.  Eat foods that are high in fiber, such as whole grains, beans, apples, broccoli, carrots, peas, and barley. General tips   Work with your doctor to lose weight if you need to.  Avoid: ? Foods with added sugar. ? Fried foods. ? Foods with partially hydrogenated oils.  Limit alcohol intake to no more than 1 drink a day for nonpregnant women and 2 drinks a day for men. One drink equals 12 oz of beer, 5 oz of wine, or 1 oz of hard liquor. Reading food labels  Check food labels for: ? Trans fats. ? Partially hydrogenated oils. ? Saturated fat (g) in each serving. ? Cholesterol (mg) in each serving. ? Fiber (g) in each serving.  Choose foods with healthy fats, such as: ? Monounsaturated fats. ? Polyunsaturated fats. ? Omega-3 fats.  Choose grain products that have whole grains. Look for the word "whole" as the first word in the ingredient list. Cooking  Cook foods using low-fat methods. These include baking, boiling, grilling, and broiling.  Eat more home-cooked foods. Eat at restaurants and buffets less often.  Avoid cooking using saturated fats, such as butter, cream, palm oil, palm kernel oil, and coconut oil. Recommended foods  Fruits  All fresh, canned (in natural juice), or frozen fruits. Vegetables  Fresh or frozen vegetables (raw, steamed, roasted, or grilled). Green salads. Grains  Whole grains, such as whole  wheat or whole grain breads, crackers, cereals, and pasta. Unsweetened oatmeal, bulgur, barley, quinoa, or brown rice. Corn or whole wheat flour tortillas. Meats and other protein foods  Ground beef (85% or leaner), grass-fed beef, or beef trimmed of fat. Skinless chicken or [place]. Ground chicken or [place]. Pork trimmed of fat. All fish and seafood. Egg whites. Dried beans, peas, or lentils. Unsalted nuts or seeds. Unsalted canned beans. Nut butters without added sugar or oil. Dairy  Low-fat or nonfat dairy products, such as skim or 1% milk, 2% or reduced-fat cheeses, low-fat and fat-free ricotta or cottage cheese, or plain low-fat and nonfat yogurt. Fats and oils  Tub margarine without trans fats. Light or reduced-fat mayonnaise and salad dressings. Avocado. Olive, canola, sesame, or safflower oils. The items listed above may not be a complete list of foods and beverages you can eat. Contact a dietitian for more information. Foods to avoid Fruits  Canned fruit in heavy syrup. Fruit in cream or butter sauce. Fried fruit. Vegetables  Vegetables cooked in cheese, cream, or butter sauce. Fried vegetables. Grains  White bread. White pasta. White rice. Cornbread. Bagels, pastries, and croissants. Crackers and snack foods that contain trans fat and hydrogenated oils. Meats and other protein foods  Fatty cuts of meat. Ribs, chicken wings, bacon, sausage, bologna, salami, chitterlings, fatback, hot dogs, bratwurst, and packaged lunch meats. Liver and organ meats. Whole eggs and egg yolks. Chicken and [place] with skin. Fried meat. Dairy  Whole or 2% milk, cream, half-and-half, and cream cheese. Whole milk cheeses. Whole-fat or sweetened yogurt. Full-fat cheeses. Nondairy creamers and whipped toppings. Processed cheese, cheese spreads, and cheese curds. Beverages  Alcohol. Sugar-sweetened drinks such as sodas, lemonade, and fruit drinks. Fats and oils  Butter, stick margarine, lard,  shortening, ghee, or bacon fat. Coconut, palm kernel, and palm oils. Sweets and desserts  Corn syrup, sugars, honey, and molasses. Candy. Jam and jelly. Syrup. Sweetened cereals. Cookies, pies, cakes, donuts, muffins, and ice cream. The items listed above may not be a complete list of foods and beverages you should avoid. Contact a dietitian for more information. Summary  Choosing the right foods helps keep your fat and cholesterol at normal levels. This can keep you from getting certain diseases.  At meals, fill one-half of your plate with vegetables and green salads.  Eat high-fiber foods, like whole grains, beans, apples, carrots, peas, and barley.  Limit added sugar, saturated fats, alcohol, and fried foods. This information is not intended to replace advice given to you by your health care provider. Make sure you discuss any questions you have with your health care provider. Document Revised: 09/08/2017 Document Reviewed: 09/22/2016 Elsevier Patient Education  [place].

## 2019-05-31 ENCOUNTER — Other Ambulatory Visit: Payer: Self-pay | Admitting: Adult Health

## 2019-05-31 DIAGNOSIS — E559 Vitamin D deficiency, unspecified: Secondary | ICD-10-CM | POA: Diagnosis not present

## 2019-05-31 DIAGNOSIS — Z1329 Encounter for screening for other suspected endocrine disorder: Secondary | ICD-10-CM | POA: Diagnosis not present

## 2019-05-31 DIAGNOSIS — N926 Irregular menstruation, unspecified: Secondary | ICD-10-CM | POA: Diagnosis not present

## 2019-05-31 DIAGNOSIS — Z13220 Encounter for screening for lipoid disorders: Secondary | ICD-10-CM | POA: Diagnosis not present

## 2019-05-31 DIAGNOSIS — Z0000 Encounter for general adult medical examination without abnormal findings: Secondary | ICD-10-CM | POA: Diagnosis not present

## 2019-06-01 NOTE — Progress Notes (Signed)
Sent to [facility].  CBC  no signs of infection, hemoglobin, hematocrit and RBC is mildly elevated- are you taking any over the counter vitamins with iron, iron supplements or shakes if so discontinue iron. CMP glucose, electrolytes. Kidney and liver function within normal limits.  Total cholesterol, triglycerides, and LDL elevated.  Discuss lifestyle modification with patient e.g. increase exercise, fiber, fruits, vegetables, lean meat, and omega 3/fish intake and decrease saturated fat.  If patient following strict diet and exercise program already please schedule follow up appointment with primary care physician Hepatitis A, B, and C acute is negative. Syphilis ( [facility] ) is negative.   TSH for thyroid within normal limits.   Your Vitamin D level is insufficient and will send in prescription Vitamin D to take 50,000 international units ONCE by mouth ONCE WEEKLY for 12 weeks. NO other over the counter vitamin D with it.    HCG test for pregnancy is still pending. Will release once results.   We will need to recheck CBC and Vitamin D level in 3 months. You can walk into our lab for this Monday to Friday 8-4pm closed for lunch 12-120.

## 2019-06-01 NOTE — Progress Notes (Signed)
HIV is non reactive. Negative.

## 2019-06-02 LAB — CBC WITH DIFFERENTIAL/PLATELET
Basophils Absolute: 0.1 10*3/uL (ref 0.0–0.2)
Basos: 1 %
EOS (ABSOLUTE): 0.2 10*3/uL (ref 0.0–0.4)
Eos: 2 %
Hematocrit: 47.3 % — ABNORMAL HIGH (ref 34.0–46.6)
Hemoglobin: 16.1 g/dL — ABNORMAL HIGH (ref 11.1–15.9)
Immature Grans (Abs): 0 10*3/uL (ref 0.0–0.1)
Immature Granulocytes: 0 %
Lymphocytes Absolute: 2.9 10*3/uL (ref 0.7–3.1)
Lymphs: 31 %
MCH: 30.3 pg (ref 26.6–33.0)
MCHC: 34 g/dL (ref 31.5–35.7)
MCV: 89 fL (ref 79–97)
Monocytes Absolute: 0.5 10*3/uL (ref 0.1–0.9)
Monocytes: 6 %
Neutrophils Absolute: 5.7 10*3/uL (ref 1.4–7.0)
Neutrophils: 60 %
Platelets: 240 10*3/uL (ref 150–450)
RBC: 5.31 x10E6/uL — ABNORMAL HIGH (ref 3.77–5.28)
RDW: 12.8 % (ref 11.7–15.4)
WBC: 9.5 10*3/uL (ref 3.4–10.8)

## 2019-06-02 LAB — COMPREHENSIVE METABOLIC PANEL
ALT: 23 IU/L (ref 0–32)
AST: 17 IU/L (ref 0–40)
Albumin/Globulin Ratio: 1.6 (ref 1.2–2.2)
Albumin: 4.6 g/dL (ref 3.9–5.0)
Alkaline Phosphatase: 75 IU/L (ref 39–117)
BUN/Creatinine Ratio: 16 (ref 9–23)
BUN: 14 mg/dL (ref 6–20)
Bilirubin Total: 0.4 mg/dL (ref 0.0–1.2)
CO2: 21 mmol/L (ref 20–29)
Calcium: 9.8 mg/dL (ref 8.7–10.2)
Chloride: 102 mmol/L (ref 96–106)
Creatinine, Ser: 0.87 mg/dL (ref 0.57–1.00)
GFR calc Af Amer: 109 mL/min/{1.73_m2} (ref >59)
GFR calc non Af Amer: 94 mL/min/{1.73_m2} (ref >59)
Globulin, Total: 2.9 g/dL (ref 1.5–4.5)
Glucose: 81 mg/dL (ref 65–99)
Potassium: 3.9 mmol/L (ref 3.5–5.2)
Sodium: 137 mmol/L (ref 134–144)
Total Protein: 7.5 g/dL (ref 6.0–8.5)

## 2019-06-02 LAB — HEPATITIS PANEL, ACUTE
Hep A IgM: NEGATIVE
Hep B C IgM: NEGATIVE
Hep C Virus Ab: <0.1 s/co ratio (ref 0.0–0.9)
Hepatitis B Surface Ag: NEGATIVE

## 2019-06-02 LAB — LIPID PANEL
Chol/HDL Ratio: 4.1 ratio (ref 0.0–4.4)
Cholesterol, Total: 204 mg/dL — ABNORMAL HIGH (ref 100–199)
HDL: 50 mg/dL (ref >39)
LDL Chol Calc (NIH): 127 mg/dL — ABNORMAL HIGH (ref 0–99)
Triglycerides: 151 mg/dL — ABNORMAL HIGH (ref 0–149)
VLDL Cholesterol Cal: 27 mg/dL (ref 5–40)

## 2019-06-02 LAB — TSH: TSH: 2.33 u[IU]/mL (ref 0.450–4.500)

## 2019-06-02 LAB — RPR: RPR Ser Ql: NONREACTIVE

## 2019-06-02 LAB — VITAMIN D 25 HYDROXY (VIT D DEFICIENCY, FRACTURES): Vit D, 25-Hydroxy: 18.1 ng/mL — ABNORMAL LOW (ref 30.0–100.0)

## 2019-06-02 LAB — HCG, SERUM, QUALITATIVE: hCG,Beta Subunit,Qual,Serum: NEGATIVE m[IU]/mL (ref <6)

## 2019-06-02 LAB — HIV ANTIBODY (ROUTINE TESTING W REFLEX): HIV Screen 4th Generation wRfx: NONREACTIVE

## 2019-06-05 ENCOUNTER — Telehealth: Payer: Self-pay | Admitting: Adult Health

## 2019-06-05 DIAGNOSIS — E559 Vitamin D deficiency, unspecified: Secondary | ICD-10-CM

## 2019-06-05 MED ORDER — VITAMIN D (ERGOCALCIFEROL) 1.25 MG (50000 UNIT) PO CAPS: 50000.0000 [IU] | ORAL_CAPSULE | ORAL | 0 refills | Status: DC

## 2019-06-05 NOTE — Telephone Encounter (Signed)
Rx sent to pharmacy per Sharyn Lull Flinchum's message from

## 2019-06-05 NOTE — Telephone Encounter (Signed)
Patient was advised on 05/29/2019 by PCP that vitamin D would be sent to [facility]. Patient would like a follow up call when rx is sent    [place], [address] Phone:  [phone]  Fax:  [phone]

## 2019-07-03 DIAGNOSIS — D2330 Other benign neoplasm of skin of unspecified part of face: Secondary | ICD-10-CM | POA: Diagnosis not present

## 2019-11-23 ENCOUNTER — Encounter: Payer: Self-pay | Admitting: Adult Health

## 2020-01-31 ENCOUNTER — Ambulatory Visit: Payer: 59 | Admitting: Obstetrics and Gynecology

## 2020-02-09 NOTE — Progress Notes (Addendum)
Established patient visit   Patient: Katherine Rivera   DOB: Oct 28, 1995   25 y.o. Female  MRN: [id number] Visit Date: 02/12/2020  Today's healthcare provider: Marcille Buffy, FNP   Chief Complaint  Patient presents with  . Neck mass   Subjective       05/29/2019 patient was treated for otitis media of right ear with Amoxicillin and given Flonase  at that time was felt to be right sided lymphadenopathy, and was advised to return within 1 month for recheck and sooner if needed. She returns today for follow up 02/12/2020. 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   She reports no improvement with the treatment from 5/10 /2021 and did not notice any difference she return today 02/12/2020 today.  She has not been using allergy medications or flonase.   She denies any sinus symptoms.  Denies any headaches.   Patient  denies any fever, body aches,chills, rash, chest pain, shortness of breath, nausea, vomiting, or diarrhea.  Denies dizziness, lightheadedness, pre syncopal or syncopal episodes.     Patient Active Problem List   Diagnosis Date Noted  . 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 Obesity, Class III, BMI 40-49.9 (morbid obesity) ([place]) 10/15/2014   Past Medical History:  Diagnosis Date  . Allergy   . Situs inversus   . Vaccine for human papilloma virus (HPV) types 6, 11, 16, and 18 administered    Social History   Tobacco Use  . Smoking status: Never Smoker  . Smokeless tobacco: Never Used  Vaping Use  . Vaping Use: Never used  Substance Use Topics  . Alcohol use: Yes  . Drug use: Never       Medications: Outpatient Medications Prior to Visit  Medication Sig  . fluticasone (FLONASE) 50 MCG/ACT nasal spray Place 2 sprays into both nostrils daily.  . [DISCONTINUED] amoxicillin (AMOXIL) 875 MG tablet Take 1 tablet (875 mg total) by mouth 2 (two) times daily.  . [DISCONTINUED] Vitamin D, Ergocalciferol, (DRISDOL) 1.25 MG (50000 UNIT) CAPS  capsule Take 1 capsule (50,000 Units total) by mouth every 7 (seven) days.   Facility-Administered Medications Prior to Visit  Medication Dose Route Frequency Provider  . etonogestrel (NEXPLANON) implant 68 mg  68 mg Subdermal Once Copland, Alicia B, PA-C    Review of Systems  Constitutional: Negative.   HENT: Positive for ear pain (no pain right ear pressure. ). Negative for congestion, dental problem, drooling, ear discharge, facial swelling, hearing loss, mouth sores, nosebleeds, postnasal drip, rhinorrhea, sinus pressure, sinus pain, sneezing, sore throat, tinnitus, trouble swallowing and voice change.   Respiratory: Negative.   Cardiovascular: Negative.   Gastrointestinal: Negative.   Genitourinary: Negative.   Musculoskeletal: Positive for neck pain (no pain, just swelling in lymph node in back of neck right side. ). Negative for arthralgias, back pain, gait problem, joint swelling and myalgias.  Allergic/Immunologic: Positive for environmental allergies.  Neurological: Negative.   Hematological: Positive for adenopathy.  Psychiatric/Behavioral: Negative.     Last CBC Lab Results  Component Value Date   WBC 9.6 02/12/2020   HGB 16.0 (H) 02/12/2020   HCT 47.1 (H) 02/12/2020   MCV 87 02/12/2020   MCH 29.5 02/12/2020   RDW 12.0 02/12/2020   PLT 257 0000000   Last metabolic panel Lab Results  Component Value Date   GLUCOSE 87 02/12/2020   NA 142 02/12/2020   K 4.2 02/12/2020   CL 104 02/12/2020  CO2 23 02/12/2020   BUN 8 02/12/2020   CREATININE 0.83 02/12/2020   GFRNONAA 99 02/12/2020   GFRAA 114 02/12/2020   CALCIUM 9.6 02/12/2020   PROT 7.4 02/12/2020   ALBUMIN 4.4 02/12/2020   LABGLOB 3.0 02/12/2020   AGRATIO 1.5 02/12/2020   BILITOT 0.5 02/12/2020   ALKPHOS 72 02/12/2020   AST 18 02/12/2020   ALT 25 02/12/2020   ANIONGAP 9 05/23/2014       Objective    BP 132/76   Pulse 91   Temp 98.8 F (37.1 C) (Oral)   Resp 16   Wt 288 lb 9.6 oz (130.9 kg)    SpO2 97%   BMI 45.20 kg/m  BP Readings from Last 3 Encounters:  02/12/20 132/76  05/29/19 110/66  01/30/19 110/90   Wt Readings from Last 3 Encounters:  02/12/20 288 lb 9.6 oz (130.9 kg)  05/29/19 279 lb 9.6 oz (126.8 kg)  01/30/19 278 lb (126.1 kg)       Physical Exam Constitutional:      General: She is not in acute distress.    Appearance: Normal appearance. She is not ill-appearing, toxic-appearing or diaphoretic.  HENT:     Head: Normocephalic and atraumatic.     Jaw: There is normal jaw occlusion.     Salivary Glands: Right salivary gland is not diffusely enlarged or tender. Left salivary gland is not diffusely enlarged or tender.     Right Ear: Tympanic membrane, ear canal and external ear normal.     Left Ear: Tympanic membrane, ear canal and external ear normal.     Nose: Nose normal. No congestion or rhinorrhea.     Mouth/Throat:     Mouth: Mucous membranes are moist.  Eyes:     General: No scleral icterus.       Right eye: No discharge.        Left eye: No discharge.     Extraocular Movements: Extraocular movements intact.     Conjunctiva/sclera: Conjunctivae normal.  Neck:     Vascular: No carotid bruit.     Trachea: Trachea and phonation normal.  Cardiovascular:     Rate and Rhythm: Normal rate and regular rhythm.     Pulses: Normal pulses.     Heart sounds: Normal heart sounds.  Pulmonary:     Effort: Pulmonary effort is normal.     Breath sounds: Normal breath sounds.  Abdominal:     Palpations: Abdomen is soft.  Musculoskeletal:     Cervical back: Normal range of motion. No rigidity or tenderness.  Lymphadenopathy:     Cervical: Cervical adenopathy present.     Right cervical: Posterior cervical adenopathy (small shotty node mobile ) present.     Left cervical: No posterior cervical adenopathy.  Neurological:     Mental Status: She is alert.       Results for orders placed or performed in visit on 02/12/20  CBC with Differential/Platelet   Result Value Ref Range   WBC 9.6 3.4 - 10.8 x10E3/uL   RBC 5.42 (H) 3.77 - 5.28 x10E6/uL   Hemoglobin 16.0 (H) 11.1 - 15.9 g/dL   Hematocrit 47.1 (H) 34.0 - 46.6 %   MCV 87 79 - 97 fL   MCH 29.5 26.6 - 33.0 pg   MCHC 34.0 31.5 - 35.7 g/dL   RDW 12.0 11.7 - 15.4 %   Platelets 257 150 - 450 x10E3/uL   Neutrophils 63 Not Estab. %   Lymphs 28 Not Estab. %  Monocytes 6 Not Estab. %   Eos 2 Not Estab. %   Basos 1 Not Estab. %   Neutrophils Absolute 6.1 1.4 - 7.0 x10E3/uL   Lymphocytes Absolute 2.7 0.7 - 3.1 x10E3/uL   Monocytes Absolute 0.6 0.1 - 0.9 x10E3/uL   EOS (ABSOLUTE) 0.2 0.0 - 0.4 x10E3/uL   Basophils Absolute 0.1 0.0 - 0.2 x10E3/uL   Immature Granulocytes 0 Not Estab. %   Immature Grans (Abs) 0.0 0.0 - 0.1 x10E3/uL  Comprehensive Metabolic Panel (CMET)  Result Value Ref Range   Glucose 87 65 - 99 mg/dL   BUN 8 6 - 20 mg/dL   Creatinine, Ser 0.83 0.57 - 1.00 mg/dL   GFR calc non Af Amer 99 >59 mL/min/1.73   GFR calc Af Amer 114 >59 mL/min/1.73   BUN/Creatinine Ratio 10 9 - 23   Sodium 142 134 - 144 mmol/L   Potassium 4.2 3.5 - 5.2 mmol/L   Chloride 104 96 - 106 mmol/L   CO2 23 20 - 29 mmol/L   Calcium 9.6 8.7 - 10.2 mg/dL   Total Protein 7.4 6.0 - 8.5 g/dL   Albumin 4.4 3.9 - 5.0 g/dL   Globulin, Total 3.0 1.5 - 4.5 g/dL   Albumin/Globulin Ratio 1.5 1.2 - 2.2   Bilirubin Total 0.5 0.0 - 1.2 mg/dL   Alkaline Phosphatase 72 44 - 121 IU/L   AST 18 0 - 40 IU/L   ALT 25 0 - 32 IU/L  VITAMIN D 25 Hydroxy (Vit-D Deficiency, Fractures)  Result Value Ref Range   Vit D, 25-Hydroxy 18.1 (L) 30.0 - 100.0 ng/mL  TSH  Result Value Ref Range   TSH 2.400 0.450 - 4.500 uIU/mL    Assessment & Plan     Posterior cervical lymphadenopathy- right  - Plan: CBC with Differential/Platelet, Comprehensive Metabolic Panel (CMET), VITAMIN D 25 Hydroxy (Vit-D Deficiency, Fractures), Ambulatory referral to ENT, TSH  Ear pressure, right - Plan: CBC with Differential/Platelet,  Comprehensive Metabolic Panel (CMET), VITAMIN D 25 Hydroxy (Vit-D Deficiency, Fractures), Ambulatory referral to ENT, TSH  Orders Placed This Encounter  Procedures  . CBC with Differential/Platelet  . Comprehensive Metabolic Panel (CMET)  . VITAMIN D 25 Hydroxy (Vit-D Deficiency, Fractures)  . TSH  . Ambulatory referral to ENT    Referral Priority:   Routine    Referral Type:   Consultation    Referral Reason:   Specialty Services Required    Requested Specialty:   Otolaryngology    Number of Visits Requested:   1  call if not heard from ENT within 2 weeks. Strict return to the office precautions given.  No signs of an infectious process. Recommend continuing Flonase nasal spray as directed as well as take second-generation antihistamine daily for allergy symptoms. Return in about 3 weeks (around 03/04/2020), or if symptoms worsen or fail to improve, for at any time for any worsening symptoms, Go to Emergency room/ urgent care if worse.     The entirety of the information documented in the History of Present Illness, Review of Systems and Physical Exam were personally obtained by me. Portions of this information were initially documented by the CMA and reviewed by me for thoroughness and accuracy.      Marcille Buffy, [place] [phone] (phone) [phone] (fax)  [place]

## 2020-02-12 ENCOUNTER — Encounter: Payer: Self-pay | Admitting: Adult Health

## 2020-02-12 ENCOUNTER — Ambulatory Visit (INDEPENDENT_AMBULATORY_CARE_PROVIDER_SITE_OTHER): Payer: 59 | Admitting: Adult Health

## 2020-02-12 ENCOUNTER — Other Ambulatory Visit: Payer: Self-pay

## 2020-02-12 VITALS — BP 132/76 | HR 91 | Temp 98.8°F | Resp 16 | Wt 288.6 lb

## 2020-02-12 DIAGNOSIS — H938X1 Other specified disorders of right ear: Secondary | ICD-10-CM

## 2020-02-12 DIAGNOSIS — D582 Other hemoglobinopathies: Secondary | ICD-10-CM | POA: Diagnosis not present

## 2020-02-12 DIAGNOSIS — R590 Localized enlarged lymph nodes: Secondary | ICD-10-CM | POA: Diagnosis not present

## 2020-02-12 NOTE — Patient Instructions (Signed)
Start xyzal 5mg  at once by mouth daily at bedtime. Start Flonase as directed daily.  ENT should call within 2 weeks.   Advised patient call the office or your primary care doctor for an appointment if no improvement within 72 hours or if any symptoms change or worsen at any time  Advised ER or urgent Care if after hours or on weekend. Call 911 for emergency symptoms at any time.Patinet verbalized understanding of all instructions given/reviewed and treatment plan and has no further questions or concerns at this time.        Eustachian Tube Dysfunction  Eustachian tube dysfunction refers to a condition in which a blockage develops in the narrow passage that connects the middle ear to the back of the nose (eustachian tube). The eustachian tube regulates air pressure in the middle ear by letting air move between the ear and nose. It also helps to drain fluid from the middle ear space. Eustachian tube dysfunction can affect one or both ears. When the eustachian tube does not function properly, air pressure, fluid, or both can build up in the middle ear. What are the causes? This condition occurs when the eustachian tube becomes blocked or cannot open normally. Common causes of this condition include:  Ear infections.  Colds and other infections that affect the nose, mouth, and throat (upper respiratory tract).  Allergies.  Irritation from cigarette smoke.  Irritation from stomach acid coming up into the esophagus (gastroesophageal reflux). The esophagus is the tube that carries food from the mouth to the stomach.  Sudden changes in air pressure, such as from descending in an airplane or scuba diving.  Abnormal growths in the nose or throat, such as: ? Growths that line the nose (nasal polyps). ? Abnormal growth of cells (tumors). ? Enlarged tissue at the back of the throat (adenoids). What increases the risk? You are more likely to develop this condition if:  You smoke.  You are  overweight.  You are a child who has: ? Certain birth defects of the mouth, such as cleft palate. ? Large tonsils or adenoids. What are the signs or symptoms? Common symptoms of this condition include:  A feeling of fullness in the ear.  Ear pain.  Clicking or popping noises in the ear.  Ringing in the ear.  Hearing loss.  Loss of balance.  Dizziness. Symptoms may get worse when the air pressure around you changes, such as when you travel to an area of high elevation, fly on an airplane, or go scuba diving. How is this diagnosed? This condition may be diagnosed based on:  Your symptoms.  A physical exam of your ears, nose, and throat.  Tests, such as those that measure: ? The movement of your eardrum (tympanogram). ? Your hearing (audiometry). How is this treated? Treatment depends on the cause and severity of your condition.  In mild cases, you may relieve your symptoms by moving air into your ears. This is called "popping the ears."  In more severe cases, or if you have symptoms of fluid in your ears, treatment may include: ? Medicines to relieve congestion (decongestants). ? Medicines that treat allergies (antihistamines). ? Nasal sprays or ear drops that contain medicines that reduce swelling (steroids). ? A procedure to drain the fluid in your eardrum (myringotomy). In this procedure, a small tube is placed in the eardrum to:  Drain the fluid.  Restore the air in the middle ear space. ? A procedure to insert a balloon device through the  nose to inflate the opening of the eustachian tube (balloon dilation). Follow these instructions at home: Lifestyle  Do not do any of the following until your health care provider approves: ? Travel to high altitudes. ? Fly in airplanes. ? Work in a Estate agent or room. ? Scuba dive.  Do not use any products that contain nicotine or tobacco, such as cigarettes and e-cigarettes. If you need help quitting, ask your  health care provider.  Keep your ears dry. Wear fitted earplugs during showering and bathing. Dry your ears completely after. General instructions  Take over-the-counter and prescription medicines only as told by your health care provider.  Use techniques to help pop your ears as recommended by your health care provider. These may include: ? Chewing gum. ? Yawning. ? Frequent, forceful swallowing. ? Closing your mouth, holding your nose closed, and gently blowing as if you are trying to blow air out of your nose.  Keep all follow-up visits as told by your health care provider. This is important. Contact a health care provider if:  Your symptoms do not go away after treatment.  Your symptoms come back after treatment.  You are unable to pop your ears.  You have: ? A fever. ? Pain in your ear. ? Pain in your head or neck. ? Fluid draining from your ear.  Your hearing suddenly changes.  You become very dizzy.  You lose your balance. Summary  Eustachian tube dysfunction refers to a condition in which a blockage develops in the eustachian tube.  It can be caused by ear infections, allergies, inhaled irritants, or abnormal growths in the nose or throat.  Symptoms include ear pain, hearing loss, or ringing in the ears.  Mild cases are treated with maneuvers to unblock the ears, such as yawning or ear popping.  Severe cases are treated with medicines. Surgery may also be done (rare). This information is not intended to replace advice given to you by your health care provider. Make sure you discuss any questions you have with your health care provider. Document Revised: 04/27/2017 Document Reviewed: 04/27/2017 Elsevier Patient Education  2021 [employer] Lymphadenopathy  Lymphadenopathy means that your lymph glands are swollen or larger than normal. Lymph glands, also called lymph nodes, are collections of tissue that filter excess fluid, bacteria, viruses, and waste from  your bloodstream. They are part of your body's disease-fighting system (immune system), which protects your body from germs. There may be different causes of lymphadenopathy, depending on where it is in your body. Some types go away on their own. Lymphadenopathy can occur anywhere that you have lymph glands, including these areas:  Neck (cervical lymphadenopathy).  Chest (mediastinal lymphadenopathy).  Lungs (hilar lymphadenopathy).  Underarms (axillary lymphadenopathy).  Groin (inguinal lymphadenopathy). When your immune system responds to germs, infection-fighting cells and fluid build up in your lymph glands. This causes some swelling and enlargement. If the lymph nodes do not go back to normal size after you have an infection or disease, your health care provider may do tests. These tests help to monitor your condition and find the reason why the glands are still swollen and enlarged. Follow these instructions at home:  Get plenty of rest.  Your health care provider may recommend over-the-counter medicines for pain. Take over-the-counter and prescription medicines only as told by your health care provider.  If directed, apply heat to swollen lymph glands as often as told by your health care provider. Use the heat source that your health care provider  recommends, such as a moist heat pack or a heating pad. ? Place a towel between your skin and the heat source. ? Leave the heat on for 20-30 minutes. ? Remove the heat if your skin turns bright red. This is especially important if you are unable to feel pain, heat, or cold. You may have a greater risk of getting burned.  Check your affected lymph glands every day for changes. Check other lymph gland areas as told by your health care provider. Check for changes such as: ? More swelling. ? Sudden increase in size. ? Redness or pain. ? Hardness.  Keep all follow-up visits. This is important.   Contact a health care provider if you  have:  Lymph glands that: ? Are still swollen after 2 weeks. ? Have suddenly gotten bigger or the swelling spreads. ? Are red, painful, or hard.  Fluid leaking from the skin near an enlarged lymph gland.  Problems with breathing.  A fever, chills, or night sweats.  Fatigue.  A sore throat.  Pain in your abdomen.  Weight loss. Get help right away if you have:  Severe pain.  Chest pain.  Shortness of breath. These symptoms may represent a serious problem that is an emergency. Do not wait to see if the symptoms will go away. Get medical help right away. Call your local emergency services (911 in the [place] Do not drive yourself to the hospital. Summary  Lymphadenopathy means that your lymph glands are swollen or larger than normal.  Lymph glands, also called lymph nodes, are collections of tissue that filter excess fluid, bacteria, viruses, and waste from the bloodstream. They are part of your body's disease-fighting system (immune system).  Lymphadenopathy can occur anywhere that you have lymph glands.  If the lymph nodes do not go back to normal size after you have an infection or disease, your health care provider may do tests to monitor your condition and find the reason why the glands are still swollen and enlarged.  Check your affected lymph glands every day for changes. Check other lymph gland areas as told by your health care provider. This information is not intended to replace advice given to you by your health care provider. Make sure you discuss any questions you have with your health care provider. Document Revised: 11/01/2019 Document Reviewed: 11/01/2019 Elsevier Patient Education  2021 [employer].

## 2020-02-13 ENCOUNTER — Other Ambulatory Visit: Payer: Self-pay | Admitting: Adult Health

## 2020-02-13 DIAGNOSIS — E559 Vitamin D deficiency, unspecified: Secondary | ICD-10-CM

## 2020-02-13 DIAGNOSIS — R590 Localized enlarged lymph nodes: Secondary | ICD-10-CM | POA: Insufficient documentation

## 2020-02-13 DIAGNOSIS — H938X1 Other specified disorders of right ear: Secondary | ICD-10-CM | POA: Insufficient documentation

## 2020-02-13 LAB — VITAMIN D 25 HYDROXY (VIT D DEFICIENCY, FRACTURES): Vit D, 25-Hydroxy: 18.1 ng/mL — ABNORMAL LOW (ref 30.0–100.0)

## 2020-02-13 LAB — CBC WITH DIFFERENTIAL/PLATELET
Basophils Absolute: 0.1 10*3/uL (ref 0.0–0.2)
Basos: 1 %
EOS (ABSOLUTE): 0.2 10*3/uL (ref 0.0–0.4)
Eos: 2 %
Hematocrit: 47.1 % — ABNORMAL HIGH (ref 34.0–46.6)
Hemoglobin: 16 g/dL — ABNORMAL HIGH (ref 11.1–15.9)
Immature Grans (Abs): 0 10*3/uL (ref 0.0–0.1)
Immature Granulocytes: 0 %
Lymphocytes Absolute: 2.7 10*3/uL (ref 0.7–3.1)
Lymphs: 28 %
MCH: 29.5 pg (ref 26.6–33.0)
MCHC: 34 g/dL (ref 31.5–35.7)
MCV: 87 fL (ref 79–97)
Monocytes Absolute: 0.6 10*3/uL (ref 0.1–0.9)
Monocytes: 6 %
Neutrophils Absolute: 6.1 10*3/uL (ref 1.4–7.0)
Neutrophils: 63 %
Platelets: 257 10*3/uL (ref 150–450)
RBC: 5.42 x10E6/uL — ABNORMAL HIGH (ref 3.77–5.28)
RDW: 12 % (ref 11.7–15.4)
WBC: 9.6 10*3/uL (ref 3.4–10.8)

## 2020-02-13 LAB — COMPREHENSIVE METABOLIC PANEL
ALT: 25 IU/L (ref 0–32)
AST: 18 IU/L (ref 0–40)
Albumin/Globulin Ratio: 1.5 (ref 1.2–2.2)
Albumin: 4.4 g/dL (ref 3.9–5.0)
Alkaline Phosphatase: 72 IU/L (ref 44–121)
BUN/Creatinine Ratio: 10 (ref 9–23)
BUN: 8 mg/dL (ref 6–20)
Bilirubin Total: 0.5 mg/dL (ref 0.0–1.2)
CO2: 23 mmol/L (ref 20–29)
Calcium: 9.6 mg/dL (ref 8.7–10.2)
Chloride: 104 mmol/L (ref 96–106)
Creatinine, Ser: 0.83 mg/dL (ref 0.57–1.00)
GFR calc Af Amer: 114 mL/min/{1.73_m2} (ref >59)
GFR calc non Af Amer: 99 mL/min/{1.73_m2} (ref >59)
Globulin, Total: 3 g/dL (ref 1.5–4.5)
Glucose: 87 mg/dL (ref 65–99)
Potassium: 4.2 mmol/L (ref 3.5–5.2)
Sodium: 142 mmol/L (ref 134–144)
Total Protein: 7.4 g/dL (ref 6.0–8.5)

## 2020-02-13 LAB — TSH: TSH: 2.4 u[IU]/mL (ref 0.450–4.500)

## 2020-02-13 MED ORDER — VITAMIN D (ERGOCALCIFEROL) 1.25 MG (50000 UNIT) PO CAPS: 50000.0000 [IU] | ORAL_CAPSULE | ORAL | 0 refills | Status: DC

## 2020-02-13 NOTE — Progress Notes (Signed)
Meds ordered this encounter  Medications  . Vitamin D, Ergocalciferol, (DRISDOL) 1.25 MG (50000 UNIT) CAPS capsule    Sig: Take 1 capsule (50,000 Units total) by mouth every 7 (seven) days. Recheck lab 1- 2 weeks after completing prescription.    Dispense:  12 capsule    Refill:  0   Orders Placed This Encounter  Procedures  . VITAMIN D 25 Hydroxy (Vit-D Deficiency, Fractures)    Standing Status:   Future    Standing Expiration Date:   06/12/2020

## 2020-02-13 NOTE — Progress Notes (Signed)
Please add ib serum transferrin saturation and serum ferritin for elevated hemoglobin.   Hemoglobin mildly elevated, avoid any iron supplements or multivitamins with iron. CMP within normal limits.  Thyroid within normal limits.   Vitamin  D is low, this can contribute to poor sleep and fatigue, will send in prescription for Vitamin D at 50,000 units by mouth once every 7 days/(once weekly) for 12 weeks. Advise recheck lab Vitamin D in 1-2 weeks after completing vitamin d prescription. Lab iis walk in and is closed during lunch during regular office hours.

## 2020-02-16 LAB — FERRITIN, SERUM (SERIAL): Ferritin: 156 ng/mL — ABNORMAL HIGH (ref 15–150)

## 2020-02-19 LAB — TRANSFERRIN SATURATION
IRON SATN MFR SERPL: 23 % Saturation
IRON SERPL-MCNC: 94 ug/dL
TRANSFERRIN SERPL-MCNC: 296 mg/dL

## 2020-02-19 LAB — SPECIMEN STATUS REPORT

## 2020-02-19 NOTE — Progress Notes (Signed)
Iron levels okay, ferritin mildly elevated we can recheck in 3 months as well as CBC, vitamin d.

## 2020-02-29 NOTE — Progress Notes (Deleted)
PCP:  Doreen Beam, FNP   No chief complaint on file.    HPI:      Ms. Katherine Rivera is a 25 y.o. No obstetric history on file. who LMP was No LMP recorded. Patient has had an implant., presents today for her annual examination.  Her menses are regular every 28-30 days, lasting 8-9 days.  Dysmenorrhea mild, improved with NSAIDs. She does not have intermenstrual bleeding.  Sex activity: single partner, contraception - condoms sometimes Would like nexplanon removed, placed 01/30/19 Last Pap: 01/30/19 Results were normal Hx of STDs: GC in her throat in past  There is no FH of breast cancer. There is no FH of ovarian cancer. The patient does do self-breast exams.  Tobacco use: The patient denies current or previous tobacco use. Alcohol use: few times wkly No drug use.  Exercise: moderately active  She does not get adequate calcium and Vitamin D in her diet. Gardasil completed.   Past Medical History:  Diagnosis Date  . Allergy   . Situs inversus   . Vaccine for human papilloma virus (HPV) types 6, 11, 16, and 18 administered     Past Surgical History:  Procedure Laterality Date  . TONSILLECTOMY AND ADENOIDECTOMY    . WISDOM TOOTH EXTRACTION      Family History  Problem Relation Age of Onset  . Cervical cancer Maternal Aunt        early 43s  . Skin cancer Paternal Grandfather   . Congestive Heart Failure Paternal Grandfather   . Sleep apnea Paternal Grandfather   . Anxiety disorder Mother   . Asthma Mother   . Depression Mother   . Glaucoma Mother   . Sleep apnea Father   . Heart murmur Father   . Hypertension Father   . Lupus Paternal Aunt   . Drug abuse Maternal Grandmother   . Hypertension Paternal Grandmother   . Breast cancer Neg Hx     Social History   Socioeconomic History  . Marital status: Single    Spouse name: Not on file  . Number of children: Not on file  . Years of education: Not on file  . Highest education level: Not on file   Occupational History  . Not on file  Tobacco Use  . Smoking status: Never Smoker  . Smokeless tobacco: Never Used  Vaping Use  . Vaping Use: Never used  Substance and Sexual Activity  . Alcohol use: Yes  . Drug use: Never  . Sexual activity: Yes    Birth control/protection: None, Condom  Other Topics Concern  . Not on file  Social History Narrative  . Not on file   Social Determinants of Health   Financial Resource Strain: Not on file  Food Insecurity: Not on file  Transportation Needs: Not on file  Physical Activity: Not on file  Stress: Not on file  Social Connections: Not on file  Intimate Partner Violence: Not on file     Current Outpatient Medications:  .  fluticasone (FLONASE) 50 MCG/ACT nasal spray, Place 2 sprays into both nostrils daily., Disp: 16 g, Rfl: 3 .  Vitamin D, Ergocalciferol, (DRISDOL) 1.25 MG (50000 UNIT) CAPS capsule, Take 1 capsule (50,000 Units total) by mouth every 7 (seven) days. Recheck lab 1- 2 weeks after completing prescription., Disp: 12 capsule, Rfl: 0  Current Facility-Administered Medications:  .  etonogestrel (NEXPLANON) implant 68 mg, 68 mg, Subdermal, Once, Korion Cuevas B, PA-C     ROS:  Review  of Systems  Constitutional: Negative for fatigue, fever and unexpected weight change.  Respiratory: Negative for cough, shortness of breath and wheezing.   Cardiovascular: Negative for chest pain, palpitations and leg swelling.  Gastrointestinal: Negative for blood in stool, constipation, diarrhea, nausea and vomiting.  Endocrine: Negative for cold intolerance, heat intolerance and polyuria.  Genitourinary: Negative for dyspareunia, dysuria, flank pain, frequency, genital sores, hematuria, menstrual problem, pelvic pain, urgency, vaginal bleeding, vaginal discharge and vaginal pain.  Musculoskeletal: Negative for back pain, joint swelling and myalgias.  Skin: Negative for rash.  Neurological: Negative for dizziness, syncope,  light-headedness, numbness and headaches.  Hematological: Negative for adenopathy.  Psychiatric/Behavioral: Positive for agitation. Negative for confusion, sleep disturbance and suicidal ideas. The patient is not nervous/anxious.    BREAST: No symptoms   Objective: There were no vitals taken for this visit.   Physical Exam Constitutional:      Appearance: She is well-developed.  Genitourinary:     Vulva normal.     No vaginal discharge, erythema or tenderness.      Right Adnexa: not tender and no mass present.    Left Adnexa: not tender and no mass present.    No cervical polyp.     Uterus is not enlarged or tender.  Breasts:     Right: No mass, nipple discharge, skin change or tenderness.     Left: No mass, nipple discharge, skin change or tenderness.    Neck:     Thyroid: No thyromegaly.  Cardiovascular:     Rate and Rhythm: Normal rate and regular rhythm.     Heart sounds: Normal heart sounds. No murmur heard.   Pulmonary:     Effort: Pulmonary effort is normal.     Breath sounds: Normal breath sounds.  Abdominal:     Palpations: Abdomen is soft.     Tenderness: There is no abdominal tenderness. There is no guarding.  Musculoskeletal:        General: Normal range of motion.     Cervical back: Normal range of motion.  Neurological:     General: No focal deficit present.     Mental Status: She is alert and oriented to person, place, and time.     Cranial Nerves: No cranial nerve deficit.  Skin:    General: Skin is warm and dry.  Psychiatric:        Mood and Affect: Mood normal.        Behavior: Behavior normal.        Thought Content: Thought content normal.        Judgment: Judgment normal.  Vitals reviewed.    Nexplanon Insertion  Patient given informed consent, signed copy in the chart, time out was performed.  Appropriate time out taken.  Patient's LEFT arm was prepped and draped in the usual sterile fashion. The ruler used to measure and mark  insertion area.  Pt was prepped with betadine swab and then injected with 1.5 cc of 2% lidocaine with epinephrine. Nexplanon removed form packaging,  Device confirmed in needle, then inserted full length of needle and withdrawn per handbook instructions.  Pt insertion site covered with steri-strip and a bandage.   Minimal blood loss.  Pt tolerated the procedure well.  Assessment/Plan: Encounter for annual routine gynecological examination  Cervical cancer screening - Plan: [facility] PAP  Screening for STD (sexually transmitted disease) - Plan: [facility] P[facility]  Nexplanon insertion - Plan: etonogestrel (NEXPLANON) implant 68 mg  No orders of the defined types were placed  in this encounter.            GYN counsel adequate intake of calcium and vitamin D, diet and exercise  She was told to remove the dressing in 12-24 hours, to keep the incision area dry for 24 hours and to remove the Steristrip in 2-3  days.  Notify us if any signs of tenderness, redness, pain, or fevers develop.       F/U  No follow-ups on file.  Oneal Schoenberger B. Auriana Scalia, PA-C 02/29/2020 10:32 AM

## 2020-02-29 NOTE — Patient Instructions (Incomplete)
I value your feedback and you entrusting us with your care. If you get a [facility] patient survey, I would appreciate you taking the time to let us know about your experience today. Thank you! ? ? ?

## 2020-03-01 ENCOUNTER — Other Ambulatory Visit: Payer: Self-pay | Admitting: Otolaryngology

## 2020-03-01 ENCOUNTER — Other Ambulatory Visit (HOSPITAL_COMMUNITY): Payer: Self-pay | Admitting: Otolaryngology

## 2020-03-01 DIAGNOSIS — H6981 Other specified disorders of Eustachian tube, right ear: Secondary | ICD-10-CM | POA: Diagnosis not present

## 2020-03-01 DIAGNOSIS — H6980 Other specified disorders of Eustachian tube, unspecified ear: Secondary | ICD-10-CM | POA: Diagnosis not present

## 2020-03-01 DIAGNOSIS — R599 Enlarged lymph nodes, unspecified: Secondary | ICD-10-CM | POA: Diagnosis not present

## 2020-03-01 DIAGNOSIS — R591 Generalized enlarged lymph nodes: Secondary | ICD-10-CM

## 2020-03-01 DIAGNOSIS — J301 Allergic rhinitis due to pollen: Secondary | ICD-10-CM | POA: Diagnosis not present

## 2020-03-04 ENCOUNTER — Ambulatory Visit: Payer: 59 | Admitting: Obstetrics and Gynecology

## 2020-03-04 DIAGNOSIS — Z3046 Encounter for surveillance of implantable subdermal contraceptive: Secondary | ICD-10-CM

## 2020-03-04 DIAGNOSIS — Z01419 Encounter for gynecological examination (general) (routine) without abnormal findings: Secondary | ICD-10-CM

## 2020-03-04 DIAGNOSIS — Z113 Encounter for screening for infections with a predominantly sexual mode of transmission: Secondary | ICD-10-CM

## 2020-03-06 ENCOUNTER — Other Ambulatory Visit: Payer: Self-pay

## 2020-03-06 ENCOUNTER — Ambulatory Visit
Admission: RE | Admit: 2020-03-06 | Discharge: 2020-03-06 | Disposition: A | Discharge status: Home / Self Care | Payer: 59 | Source: Ambulatory Visit | Attending: Otolaryngology | Admitting: Otolaryngology

## 2020-03-06 DIAGNOSIS — R591 Generalized enlarged lymph nodes: Secondary | ICD-10-CM | POA: Diagnosis not present

## 2020-03-06 DIAGNOSIS — R599 Enlarged lymph nodes, unspecified: Secondary | ICD-10-CM | POA: Diagnosis not present

## 2020-03-15 ENCOUNTER — Ambulatory Visit (INDEPENDENT_AMBULATORY_CARE_PROVIDER_SITE_OTHER): Payer: 59 | Admitting: Adult Health

## 2020-03-15 ENCOUNTER — Encounter: Payer: Self-pay | Admitting: Adult Health

## 2020-03-15 ENCOUNTER — Other Ambulatory Visit: Payer: Self-pay

## 2020-03-15 VITALS — BP 148/81 | HR 123 | Temp 98.0°F | Resp 16 | Wt 293.8 lb

## 2020-03-15 DIAGNOSIS — R7989 Other specified abnormal findings of blood chemistry: Secondary | ICD-10-CM

## 2020-03-15 DIAGNOSIS — R590 Localized enlarged lymph nodes: Secondary | ICD-10-CM

## 2020-03-15 DIAGNOSIS — D582 Other hemoglobinopathies: Secondary | ICD-10-CM

## 2020-03-15 DIAGNOSIS — E559 Vitamin D deficiency, unspecified: Secondary | ICD-10-CM | POA: Diagnosis not present

## 2020-03-15 HISTORY — DX: Other hemoglobinopathies: D58.2

## 2020-03-15 HISTORY — DX: Other specified abnormal findings of blood chemistry: R79.89

## 2020-03-15 NOTE — Patient Instructions (Signed)
Health Maintenance, Female Adopting a healthy lifestyle and getting preventive care are important in promoting health and wellness. Ask your health care provider about:  The right schedule for you to have regular tests and exams.  Things you can do on your own to prevent diseases and keep yourself healthy. What should I know about diet, weight, and exercise? Eat a healthy diet  Eat a diet that includes plenty of vegetables, fruits, low-fat dairy products, and lean protein.  Do not eat a lot of foods that are high in solid fats, added sugars, or sodium.   Maintain a healthy weight Body mass index (BMI) is used to identify weight problems. It estimates body fat based on height and weight. Your health care provider can help determine your BMI and help you achieve or maintain a healthy weight. Get regular exercise Get regular exercise. This is one of the most important things you can do for your health. Most adults should:  Exercise for at least 150 minutes each week. The exercise should increase your heart rate and make you sweat (moderate-intensity exercise).  Do strengthening exercises at least twice a week. This is in addition to the moderate-intensity exercise.  Spend less time sitting. Even light physical activity can be beneficial. Watch cholesterol and blood lipids Have your blood tested for lipids and cholesterol at 25 years of age, then have this test every 5 years. Have your cholesterol levels checked more often if:  Your lipid or cholesterol levels are high.  You are older than 25 years of age.  You are at high risk for heart disease. What should I know about cancer screening? Depending on your health history and family history, you may need to have cancer screening at various ages. This may include screening for:  Breast cancer.  Cervical cancer.  Colorectal cancer.  Skin cancer.  Lung cancer. What should I know about heart disease, diabetes, and high blood  pressure? Blood pressure and heart disease  High blood pressure causes heart disease and increases the risk of stroke. This is more likely to develop in people who have high blood pressure readings, are of African descent, or are overweight.  Have your blood pressure checked: ? Every 3-5 years if you are 18-39 years of age. ? Every year if you are 40 years old or older. Diabetes Have regular diabetes screenings. This checks your fasting blood sugar level. Have the screening done:  Once every three years after age 40 if you are at a normal weight and have a low risk for diabetes.  More often and at a younger age if you are overweight or have a high risk for diabetes. What should I know about preventing infection? Hepatitis B If you have a higher risk for hepatitis B, you should be screened for this virus. Talk with your health care provider to find out if you are at risk for hepatitis B infection. Hepatitis C Testing is recommended for:  Everyone born from 1945 through 1965.  Anyone with known risk factors for hepatitis C. Sexually transmitted infections (STIs)  Get screened for STIs, including gonorrhea and chlamydia, if: ? You are sexually active and are younger than 24 years of age. ? You are older than 24 years of age and your health care provider tells you that you are at risk for this type of infection. ? Your sexual activity has changed since you were last screened, and you are at increased risk for chlamydia or gonorrhea. Ask your health care provider   if you are at risk.  Ask your health care provider about whether you are at high risk for HIV. Your health care provider may recommend a prescription medicine to help prevent HIV infection. If you choose to take medicine to prevent HIV, you should first get tested for HIV. You should then be tested every 3 months for as long as you are taking the medicine. Pregnancy  If you are about to stop having your period (premenopausal) and  you may become pregnant, seek counseling before you get pregnant.  Take 400 to 800 micrograms (mcg) of folic acid every day if you become pregnant.  Ask for birth control (contraception) if you want to prevent pregnancy. Osteoporosis and menopause Osteoporosis is a disease in which the bones lose minerals and strength with aging. This can result in bone fractures. If you are 65 years old or older, or if you are at risk for osteoporosis and fractures, ask your health care provider if you should:  Be screened for bone loss.  Take a calcium or vitamin D supplement to lower your risk of fractures.  Be given hormone replacement therapy (HRT) to treat symptoms of menopause. Follow these instructions at home: Lifestyle  Do not use any products that contain nicotine or tobacco, such as cigarettes, e-cigarettes, and chewing tobacco. If you need help quitting, ask your health care provider.  Do not use street drugs.  Do not share needles.  Ask your health care provider for help if you need support or information about quitting drugs. Alcohol use  Do not drink alcohol if: ? Your health care provider tells you not to drink. ? You are pregnant, may be pregnant, or are planning to become pregnant.  If you drink alcohol: ? Limit how much you use to 0-1 drink a day. ? Limit intake if you are breastfeeding.  Be aware of how much alcohol is in your drink. In the [place] one drink equals one 12 oz bottle of beer (355 mL), one 5 oz glass of wine (148 mL), or one 1 oz glass of hard liquor (44 mL). General instructions  Schedule regular health, dental, and eye exams.  Stay current with your vaccines.  Tell your health care provider if: ? You often feel depressed. ? You have ever been abused or do not feel safe at home. Summary  Adopting a healthy lifestyle and getting preventive care are important in promoting health and wellness.  Follow your health care provider's instructions about healthy  diet, exercising, and getting tested or screened for diseases.  Follow your health care provider's instructions on monitoring your cholesterol and blood pressure. This information is not intended to replace advice given to you by your health care provider. Make sure you discuss any questions you have with your health care provider. Document Revised: 12/29/2017 Document Reviewed: 12/29/2017 Elsevier Patient Education  2021 [employer]  

## 2020-03-15 NOTE — Progress Notes (Signed)
Established patient visit   Patient: Katherine Rivera   DOB: Apr 14, 1995   24 y.o. Female  MRN: [id number] Visit Date: 03/15/2020  Today's healthcare provider: Marcille Buffy, FNP   Chief Complaint  Patient presents with  . Follow-up   Subjective    HPI  Follow up for Posterior cervical lymphadenopathy- right   The patient was last seen for this 1 months ago. Changes made at last visit include labs ordered and referral made to ENT. Ultrasound of soft tissue head &neck IMPRESSION: The patient's palpable area of concern corresponds to a small cervical lymph node that appears to be morphologically normal and is likely reactive. In the absence of interval growth, no further follow-up is recommended.  She feels that condition is Unchanged.  Hearing was fine ay ENT And he advised her to get Zyrtec and Flonase.   Patient  denies any fever, body aches,chills, rash, chest pain, shortness of breath, nausea, vomiting, or diarrhea.  Denies dizziness, lightheadedness, pre syncopal or syncopal episodes.   -----------------------------------------------------------------------------------------  Patient Active Problem List   Diagnosis Date Noted  . Vitamin D deficiency 03/15/2020  . Elevated ferritin 03/15/2020  . Elevated hemoglobin ([place]) 03/15/2020  . Ear pressure, right 02/13/2020  . Posterior cervical lymphadenopathy- right  02/13/2020  . 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 Obesity, Class III, BMI 40-49.9 (morbid obesity) ([place]) 10/15/2014   Past Medical History:  Diagnosis Date  . Allergy   . Situs inversus   . Vaccine for human papilloma virus (HPV) types 6, 11, 16, and 18 administered    Past Surgical History:  Procedure Laterality Date  . TONSILLECTOMY AND ADENOIDECTOMY    . WISDOM TOOTH EXTRACTION     Social History   Tobacco Use  . Smoking status: Never Smoker  . Smokeless tobacco: Never Used  Vaping Use  . Vaping Use: Never used  Substance Use  Topics  . Alcohol use: Yes  . Drug use: Never   Social History   Socioeconomic History  . Marital status: Single    Spouse name: Not on file  . Number of children: Not on file  . Years of education: Not on file  . Highest education level: Not on file  Occupational History  . Not on file  Tobacco Use  . Smoking status: Never Smoker  . Smokeless tobacco: Never Used  Vaping Use  . Vaping Use: Never used  Substance and Sexual Activity  . Alcohol use: Yes  . Drug use: Never  . Sexual activity: Yes    Birth control/protection: None, Condom  Other Topics Concern  . Not on file  Social History Narrative  . Not on file   Social Determinants of Health   Financial Resource Strain: Not on file  Food Insecurity: Not on file  Transportation Needs: Not on file  Physical Activity: Not on file  Stress: Not on file  Social Connections: Not on file  Intimate Partner Violence: Not on file   Family Status  Relation Name Status  . [employer]  . PGF  Alive  . Mother  (Not Specified)  . Father  (Not Specified)  . Ethlyn Daniels  (Not Specified)  . MGM  (Not Specified)  . PGM  (Not Specified)  . Neg Hx  (Not Specified)   Family History  Problem Relation Age of Onset  . Cervical cancer Maternal Aunt        early 88s  . Skin cancer Paternal Grandfather   .  Congestive Heart Failure Paternal Grandfather   . Sleep apnea Paternal Grandfather   . Anxiety disorder Mother   . Asthma Mother   . Depression Mother   . Glaucoma Mother   . Sleep apnea Father   . Heart murmur Father   . Hypertension Father   . Lupus Paternal Aunt   . Drug abuse Maternal Grandmother   . Hypertension Paternal Grandmother   . Breast cancer Neg Hx    Allergies  Allergen Reactions  . Latex Rash       Medications: Outpatient Medications Prior to Visit  Medication Sig  . fluticasone (FLONASE) 50 MCG/ACT nasal spray Place 2 sprays into both nostrils daily.  . Vitamin D, Ergocalciferol, (DRISDOL) 1.25 MG  (50000 UNIT) CAPS capsule Take 1 capsule (50,000 Units total) by mouth every 7 (seven) days. Recheck lab 1- 2 weeks after completing prescription.   Facility-Administered Medications Prior to Visit  Medication Dose Route Frequency Provider  . etonogestrel (NEXPLANON) implant 68 mg  68 mg Subdermal Once Copland, Alicia B, PA-C    Review of Systems  Constitutional: Negative.   HENT: Negative.   Respiratory: Negative.   Cardiovascular: Negative.   Gastrointestinal: Negative.   Genitourinary: Negative.   Musculoskeletal: Negative.   Skin: Negative.   Neurological: Negative.   Hematological: Positive for adenopathy. Does not bruise/bleed easily.  Psychiatric/Behavioral: Negative.     Last CBC Lab Results  Component Value Date   WBC 9.6 02/12/2020   HGB 16.0 (H) 02/12/2020   HCT 47.1 (H) 02/12/2020   MCV 87 02/12/2020   MCH 29.5 02/12/2020   RDW 12.0 02/12/2020   PLT 257 61/44/3154   Last metabolic panel Lab Results  Component Value Date   GLUCOSE 87 02/12/2020   NA 142 02/12/2020   K 4.2 02/12/2020   CL 104 02/12/2020   CO2 23 02/12/2020   BUN 8 02/12/2020   CREATININE 0.83 02/12/2020   GFRNONAA 99 02/12/2020   GFRAA 114 02/12/2020   CALCIUM 9.6 02/12/2020   PROT 7.4 02/12/2020   ALBUMIN 4.4 02/12/2020   LABGLOB 3.0 02/12/2020   AGRATIO 1.5 02/12/2020   BILITOT 0.5 02/12/2020   ALKPHOS 72 02/12/2020   AST 18 02/12/2020   ALT 25 02/12/2020   ANIONGAP 9 05/23/2014   Last lipids Lab Results  Component Value Date   CHOL 204 (H) 05/31/2019   HDL 50 05/31/2019   LDLCALC 127 (H) 05/31/2019   TRIG 151 (H) 05/31/2019   CHOLHDL 4.1 05/31/2019   Last hemoglobin A1c No results found for: HGBA1C Last thyroid functions Lab Results  Component Value Date   TSH 2.400 02/12/2020   Last vitamin D Lab Results  Component Value Date   VD25OH 18.1 (L) 02/12/2020   Last vitamin B12 and Folate No results found for: VITAMINB12, FOLATE     Objective    BP (!) 148/81    Pulse (!) 123   Temp 98 F (36.7 C) (Oral)   Resp 16   Wt 293 lb 12.8 oz (133.3 kg)   SpO2 100%   BMI 46.02 kg/m  BP Readings from Last 3 Encounters:  03/15/20 (!) 148/81  02/12/20 132/76  05/29/19 110/66   Wt Readings from Last 3 Encounters:  03/15/20 293 lb 12.8 oz (133.3 kg)  02/12/20 288 lb 9.6 oz (130.9 kg)  05/29/19 279 lb 9.6 oz (126.8 kg)       Physical Exam Constitutional:      General: She is not in acute distress.  Appearance: She is obese. She is not ill-appearing, toxic-appearing or diaphoretic.     Comments: Patient appers well, not sickly. Speaking in complete sentences. Patient moves on and off of exam table and in room without difficulty. Gait is normal in hall and in room. Patient is oriented to person place time and situation. Patient answers questions appropriately and engages eye contact and verbal dialect with provider.    HENT:     Head: Normocephalic and atraumatic.     Jaw: There is normal jaw occlusion.     Comments:  Cobblestoning posterior pharynx; bilateral allergic shiners; left Tympanic membrane  air fluid level clear fluid ;right tympanic membrane with slight bulging, yellow brown fluid behind dull tympanic membrane  bilateral nasal turbinates mild edema erythema clear discharge;       Right Ear: Hearing and external ear normal. A middle ear effusion is present. There is no impacted cerumen. Tympanic membrane is not perforated, erythematous or bulging.     Left Ear: Hearing and external ear normal. A middle ear effusion is present. There is no impacted cerumen. Tympanic membrane is not perforated, erythematous or bulging.     Nose: No congestion or rhinorrhea.     Mouth/Throat:     Mouth: Mucous membranes are moist.     Pharynx: No oropharyngeal exudate or posterior oropharyngeal erythema.  Eyes:     General: No scleral icterus.       Right eye: No discharge.        Left eye: No discharge.     Extraocular Movements: Extraocular movements  intact.     Conjunctiva/sclera: Conjunctivae normal.     Pupils: Pupils are equal, round, and reactive to light.  Neck:     Vascular: No carotid bruit.  Cardiovascular:     Rate and Rhythm: Normal rate and regular rhythm.     Pulses: Normal pulses.     Heart sounds: Normal heart sounds.  Pulmonary:     Effort: Pulmonary effort is normal. No respiratory distress.     Breath sounds: Normal breath sounds. No stridor. No wheezing, rhonchi or rales.  Chest:     Chest wall: No tenderness.  Abdominal:     General: There is no distension.     Palpations: Abdomen is soft. There is no mass.     Tenderness: There is no abdominal tenderness. There is no right CVA tenderness, left CVA tenderness, guarding or rebound.     Hernia: No hernia is present.  Musculoskeletal:        General: No swelling, tenderness, deformity or signs of injury. Normal range of motion.     Cervical back: Normal range of motion and neck supple. No rigidity or tenderness.     Right lower leg: No edema.     Left lower leg: No edema.  Skin:    General: Skin is warm and dry.     Capillary Refill: Capillary refill takes less than 2 seconds.     Findings: No lesion.  Neurological:     Mental Status: She is alert and oriented to person, place, and time.     Cranial Nerves: No cranial nerve deficit.     Sensory: No sensory deficit.     Motor: No weakness.     Coordination: Coordination normal.     Gait: Gait normal.     Deep Tendon Reflexes: Reflexes normal.  Psychiatric:        Mood and Affect: Mood normal.  Behavior: Behavior normal.        Thought Content: Thought content normal.        Judgment: Judgment normal.      No results found for any visits on 03/15/20.  Assessment & Plan     1. Elevated hemoglobin ([place]) Recheck labs. May refer to hematology if still elevated rule out hemochromatosis. Denies any family history, not taking any iron supplements. Is only mild elevatino.  - Iron and TIBC - B12 -  CBC with Differential/Platelet  2. Elevated ferritin - Iron and TIBC - B12 - CBC with Differential/Platelet  3. Vitamin D deficiency  - VITAMIN D 25 Hydroxy (Vit-D Deficiency, Fractures) 4. Posterior cervical lymphadenopathy  Has seen ENT and had soft tissue ultrasound was within normal.  Will monitor if enlarging or any changes return to office immediately.  Recent otitis media of right ear was treated and improved.   Red Flags discussed. The patient was given clear instructions to go to ER or return to medical center if any red flags develop, symptoms do not improve, worsen or new problems develop. They verbalized understanding.  Return in about 3 months (around 06/12/2020), or if symptoms worsen or fail to improve, for at any time for any worsening symptoms, Go to Emergency room/ urgent care if worse.     The entirety of the information documented in the History of Present Illness, Review of Systems and Physical Exam were personally obtained by me. Portions of this information were initially documented by the CMA and reviewed by me for thoroughness and accuracy.    Marcille Buffy, [place] [phone] (phone) [phone] (fax)  [place]

## 2020-05-16 DIAGNOSIS — E559 Vitamin D deficiency, unspecified: Secondary | ICD-10-CM | POA: Diagnosis not present

## 2020-05-16 DIAGNOSIS — R7989 Other specified abnormal findings of blood chemistry: Secondary | ICD-10-CM | POA: Diagnosis not present

## 2020-05-16 DIAGNOSIS — D582 Other hemoglobinopathies: Secondary | ICD-10-CM | POA: Diagnosis not present

## 2020-05-17 DIAGNOSIS — H5213 Myopia, bilateral: Secondary | ICD-10-CM | POA: Diagnosis not present

## 2020-05-17 LAB — CBC WITH DIFFERENTIAL/PLATELET
Basophils Absolute: 0.1 10*3/uL (ref 0.0–0.2)
Basos: 1 %
EOS (ABSOLUTE): 0.2 10*3/uL (ref 0.0–0.4)
Eos: 2 %
Hematocrit: 44.1 % (ref 34.0–46.6)
Hemoglobin: 15.2 g/dL (ref 11.1–15.9)
Immature Grans (Abs): 0 10*3/uL (ref 0.0–0.1)
Immature Granulocytes: 0 %
Lymphocytes Absolute: 3 10*3/uL (ref 0.7–3.1)
Lymphs: 28 %
MCH: 30.4 pg (ref 26.6–33.0)
MCHC: 34.5 g/dL (ref 31.5–35.7)
MCV: 88 fL (ref 79–97)
Monocytes Absolute: 0.6 10*3/uL (ref 0.1–0.9)
Monocytes: 6 %
Neutrophils Absolute: 6.8 10*3/uL (ref 1.4–7.0)
Neutrophils: 63 %
Platelets: 260 10*3/uL (ref 150–450)
RBC: 5 x10E6/uL (ref 3.77–5.28)
RDW: 12.4 % (ref 11.7–15.4)
WBC: 10.7 10*3/uL (ref 3.4–10.8)

## 2020-05-17 LAB — VITAMIN D 25 HYDROXY (VIT D DEFICIENCY, FRACTURES): Vit D, 25-Hydroxy: 30.3 ng/mL (ref 30.0–100.0)

## 2020-05-17 LAB — IRON AND TIBC
Iron Saturation: 19 % (ref 15–55)
Iron: 69 ug/dL (ref 27–159)
Total Iron Binding Capacity: 356 ug/dL (ref 250–450)
UIBC: 287 ug/dL (ref 131–425)

## 2020-05-17 LAB — VITAMIN B12: Vitamin B-12: 412 pg/mL (ref 232–1245)

## 2020-05-17 NOTE — Progress Notes (Signed)
Labs within normal limits

## 2020-06-09 ENCOUNTER — Other Ambulatory Visit: Payer: Self-pay | Admitting: Adult Health

## 2020-06-09 DIAGNOSIS — E559 Vitamin D deficiency, unspecified: Secondary | ICD-10-CM

## 2020-07-16 ENCOUNTER — Ambulatory Visit (INDEPENDENT_AMBULATORY_CARE_PROVIDER_SITE_OTHER): Payer: 59 | Admitting: Obstetrics and Gynecology

## 2020-07-16 ENCOUNTER — Other Ambulatory Visit (HOSPITAL_COMMUNITY)
Admission: RE | Admit: 2020-07-16 | Discharge: 2020-07-16 | Disposition: A | Discharge status: Home / Self Care | Payer: 59 | Source: Ambulatory Visit | Attending: Obstetrics and Gynecology | Admitting: Obstetrics and Gynecology

## 2020-07-16 ENCOUNTER — Encounter: Payer: Self-pay | Admitting: Obstetrics and Gynecology

## 2020-07-16 ENCOUNTER — Other Ambulatory Visit: Payer: Self-pay

## 2020-07-16 VITALS — BP 120/90 | Ht 67.0 in | Wt 292.0 lb

## 2020-07-16 DIAGNOSIS — Z3046 Encounter for surveillance of implantable subdermal contraceptive: Secondary | ICD-10-CM | POA: Diagnosis not present

## 2020-07-16 DIAGNOSIS — Z113 Encounter for screening for infections with a predominantly sexual mode of transmission: Secondary | ICD-10-CM | POA: Insufficient documentation

## 2020-07-16 DIAGNOSIS — Z3169 Encounter for other general counseling and advice on procreation: Secondary | ICD-10-CM | POA: Diagnosis not present

## 2020-07-16 DIAGNOSIS — Z01419 Encounter for gynecological examination (general) (routine) without abnormal findings: Secondary | ICD-10-CM | POA: Diagnosis not present

## 2020-07-16 NOTE — Patient Instructions (Signed)
I value your feedback and you entrusting us with your care. If you get a [place] patient survey, I would appreciate you taking the time to let us know about your experience today. Thank you!  Remove the dressing in 24 hours,  keep the incision area dry for 24 hours and remove the Steristrip in 2-3  days.  Notify us if any signs of tenderness, redness, pain, or fevers develop.   

## 2020-07-16 NOTE — Progress Notes (Signed)
PCP:  Doreen Beam, FNP   Chief Complaint  Patient presents with   Gynecologic Exam    Nexplanon removal, not interested in new [facility]     HPI:      Ms. Katherine Rivera is a 25 y.o. No obstetric history on file. who LMP was No LMP recorded. Patient has had an implant., presents today for her annual examination.  Her menses are random and infrequent with nexplanon, lasting 5-7 days.  Dysmenorrhea mild, improved with NSAIDs. She does not have intermenstrual bleeding. Menses prior to nexplanon were "twice a month", but sounds more like Q3 wks.   Sex activity: single partner, contraception - nexplanon placed 01/30/19. Would like removed today for conception. Is taking MVI, plans to start PNVs. Last Pap: 01/30/19 Results were normal Hx of STDs: GC in her throat in past  There is no FH of breast cancer. There is no FH of ovarian cancer. The patient does do self-breast exams.  Tobacco use: The patient denies current or previous tobacco use. Alcohol use: few times wkly No drug use.  Exercise: moderately active  She does get adequate calcium and Vitamin D in her diet. Gardasil completed.   Past Medical History:  Diagnosis Date   Allergy    Situs inversus    Vaccine for human papilloma virus (HPV) types 6, 67, 35, and 18 administered     Past Surgical History:  Procedure Laterality Date   TONSILLECTOMY AND ADENOIDECTOMY     WISDOM TOOTH EXTRACTION      Family History  Problem Relation Age of Onset   Anxiety disorder Mother    Asthma Mother    Depression Mother    Glaucoma Mother    Sleep apnea Father    Heart murmur Father    Hypertension Father    Cervical cancer Maternal Aunt        early 14s   Lupus Paternal Aunt    Drug abuse Maternal Grandmother    Hypertension Paternal Grandmother    Skin cancer Paternal Grandfather    Congestive Heart Failure Paternal Grandfather    Sleep apnea Paternal Grandfather    Breast cancer Neg Hx     Social History    Socioeconomic History   Marital status: Single    Spouse name: Not on file   Number of children: Not on file   Years of education: Not on file   Highest education level: Not on file  Occupational History   Not on file  Tobacco Use   Smoking status: Never   Smokeless tobacco: Never  Vaping Use   Vaping Use: Never used  Substance and Sexual Activity   Alcohol use: Yes   Drug use: Never   Sexual activity: Yes    Birth control/protection: Implant  Other Topics Concern   Not on file  Social History Narrative   Not on file   Social Determinants of Health   Financial Resource Strain: Not on file  Food Insecurity: Not on file  Transportation Needs: Not on file  Physical Activity: Not on file  Stress: Not on file  Social Connections: Not on file  Intimate Partner Violence: Not on file     Current Outpatient Medications:    fluticasone (FLONASE) 50 MCG/ACT nasal spray, Place 2 sprays into both nostrils daily., Disp: 16 g, Rfl: 3     ROS:  Review of Systems  Constitutional:  Negative for fatigue, fever and unexpected weight change.  Respiratory:  Negative for cough, shortness of  breath and wheezing.   Cardiovascular:  Negative for chest pain, palpitations and leg swelling.  Gastrointestinal:  Negative for blood in stool, constipation, diarrhea, nausea and vomiting.  Endocrine: Negative for cold intolerance, heat intolerance and polyuria.  Genitourinary:  Negative for dyspareunia, dysuria, flank pain, frequency, genital sores, hematuria, menstrual problem, pelvic pain, urgency, vaginal bleeding, vaginal discharge and vaginal pain.  Musculoskeletal:  Negative for back pain, joint swelling and myalgias.  Skin:  Negative for rash.  Neurological:  Negative for dizziness, syncope, light-headedness, numbness and headaches.  Hematological:  Negative for adenopathy.  Psychiatric/Behavioral:  Negative for agitation, confusion, sleep disturbance and suicidal ideas. The patient is  not nervous/anxious.   BREAST: No symptoms   Objective: BP 120/90   Ht 5\' 7"  (1.702 m)   Wt 292 lb (132.5 kg)   BMI 45.73 kg/m    Physical Exam Constitutional:      Appearance: She is well-developed.  Genitourinary:     Vulva normal.     Right Labia: No rash, tenderness or lesions.    Left Labia: No tenderness, lesions or rash.    No vaginal discharge, erythema or tenderness.      Right Adnexa: not tender and no mass present.    Left Adnexa: not tender and no mass present.    No cervical friability or polyp.     Uterus is not enlarged or tender.  Breasts:    Right: No mass, nipple discharge, skin change or tenderness.     Left: No mass, nipple discharge, skin change or tenderness.  Neck:     Thyroid: No thyromegaly.  Cardiovascular:     Rate and Rhythm: Normal rate and regular rhythm.     Heart sounds: Normal heart sounds. No murmur heard. Pulmonary:     Effort: Pulmonary effort is normal.     Breath sounds: Normal breath sounds.  Abdominal:     Palpations: Abdomen is soft.     Tenderness: There is no abdominal tenderness. There is no guarding or rebound.  Musculoskeletal:        General: Normal range of motion.     Cervical back: Normal range of motion.  Lymphadenopathy:     Cervical: No cervical adenopathy.  Neurological:     General: No focal deficit present.     Mental Status: She is alert and oriented to person, place, and time.     Cranial Nerves: No cranial nerve deficit.  Skin:    General: Skin is warm and dry.  Psychiatric:        Mood and Affect: Mood normal.        Behavior: Behavior normal.        Thought Content: Thought content normal.        Judgment: Judgment normal.  Vitals reviewed.   Nexplanon removal Procedure note - The Nexplanon was noted in the patient's arm and the end was identified. The skin was cleansed with a Betadine solution. A small injection of subcutaneous lidocaine with epinephrine was given over the end of the implant. An  incision was made at the end of the implant. The rod was noted in the incision and grasped with a hemostat. It was noted to be intact.  Steri-Strip was placed approximating the incision. Hemostasis was noted.   Assessment/Plan: Encounter for annual routine gynecological examination  Screening for STD (sexually transmitted disease) - Plan: Cervicovaginal ancillary only  Nexplanon removal--She was told to remove the dressing in 12-24 hours, to keep the incision area dry for  24 hours and to remove the Steristrip in 2-3  days.  Notify us if any signs of tenderness, redness, pain, or fevers develop.  Pre-conception counseling--start PNVs, f/u prn NOB.   GYN counsel adequate intake of calcium and vitamin D, diet and exercise     F/U  Return in about 1 year (around 07/16/2021).  Layliana Devins B. Adan Baehr, PA-C 07/16/2020 2:23 PM

## 2020-07-18 LAB — CERVICOVAGINAL ANCILLARY ONLY
Chlamydia: NEGATIVE
Comment: NEGATIVE
Comment: NORMAL
Neisseria Gonorrhea: NEGATIVE

## 2021-07-07 ENCOUNTER — Ambulatory Visit (INDEPENDENT_AMBULATORY_CARE_PROVIDER_SITE_OTHER): Payer: 59

## 2021-07-07 VITALS — Ht 67.0 in | Wt 304.0 lb

## 2021-07-07 DIAGNOSIS — N926 Irregular menstruation, unspecified: Secondary | ICD-10-CM | POA: Diagnosis not present

## 2021-07-07 LAB — POCT URINE PREGNANCY: Preg Test, Ur: POSITIVE — AB

## 2021-07-07 NOTE — Progress Notes (Signed)
Subjective:    Katherine Rivera is a 26 y.o. female who presents for evaluation of amenorrhea. She believes she could be pregnant. Pregnancy is desired.  Last period was normal and heavy .   No LMP recorded. Patient has had an implant. Her cycles are irregular and recently heavy. She knows her end date was 05/23/2021 doesn't remember when she started.    Lab Review Urine HCG: positive    Assessment:    Absence of menstruation.     Plan:    Pregnancy Test:  Positive: EDC. Briefly discussed positive results and sent to check out for scheduling for New OB appointments.

## 2021-07-18 ENCOUNTER — Ambulatory Visit (INDEPENDENT_AMBULATORY_CARE_PROVIDER_SITE_OTHER): Payer: 59 | Admitting: Nurse Practitioner

## 2021-07-18 ENCOUNTER — Other Ambulatory Visit: Payer: Self-pay

## 2021-07-18 ENCOUNTER — Encounter: Payer: Self-pay | Admitting: Nurse Practitioner

## 2021-07-18 VITALS — BP 124/76 | HR 102 | Temp 98.5°F | Resp 16 | Ht 67.0 in | Wt 305.4 lb

## 2021-07-18 DIAGNOSIS — D582 Other hemoglobinopathies: Secondary | ICD-10-CM

## 2021-07-18 DIAGNOSIS — E559 Vitamin D deficiency, unspecified: Secondary | ICD-10-CM | POA: Diagnosis not present

## 2021-07-18 DIAGNOSIS — N6313 Unspecified lump in the right breast, lower outer quadrant: Secondary | ICD-10-CM

## 2021-07-18 DIAGNOSIS — Z3A08 8 weeks gestation of pregnancy: Secondary | ICD-10-CM

## 2021-07-18 DIAGNOSIS — Z0000 Encounter for general adult medical examination without abnormal findings: Secondary | ICD-10-CM

## 2021-07-18 DIAGNOSIS — Z13220 Encounter for screening for lipoid disorders: Secondary | ICD-10-CM

## 2021-07-18 DIAGNOSIS — Z131 Encounter for screening for diabetes mellitus: Secondary | ICD-10-CM | POA: Diagnosis not present

## 2021-07-18 NOTE — Progress Notes (Signed)
Name: Katherine Rivera   MRN: [id number]    DOB: December 15, 1995   Date:07/18/2021       Progress Note  Subjective  Chief Complaint  Chief Complaint  Patient presents with   Establish Care   Establish care:  Her last physical was last year, she says that her hgb was elevated and she had vitamin d deficiency which she is taking supplementation for.  She is currently pregnant, has appointment with Daneil Dan in July.  She is currently taking prenatal vitamins.   HPI  Patient presents for annual CPE.  Diet: fairly well balanced, struggles with vegetables Exercise: she has a physical job as a Marine scientist, discussed recommendations of 150 min physical activity a week Sleep: 4-5 hours of sleep  [place] Office Visit from 07/18/2021 in [facility]  AUDIT-C Score 0      Depression: Phq 9 is  negative    07/18/2021    8:07 AM 05/29/2019   10:52 AM  Depression screen PHQ 2/9  Decreased Interest 0 0  Down, Depressed, Hopeless 0 0  PHQ - 2 Score 0 0  Altered sleeping  2  Tired, decreased energy  2  Change in appetite  2  Feeling bad or failure about yourself   0  Trouble concentrating  0  Moving slowly or fidgety/restless  0  Suicidal thoughts  0  PHQ-9 Score  6  Difficult doing work/chores  Not difficult at all   Hypertension: BP Readings from Last 3 Encounters:  07/18/21 124/76  07/16/20 120/90  03/15/20 (!) 148/81   Obesity: Wt Readings from Last 3 Encounters:  07/18/21 (!) 305 lb 6.4 oz (138.5 kg)  07/07/21 (!) 304 lb (137.9 kg)  07/16/20 292 lb (132.5 kg)   BMI Readings from Last 3 Encounters:  07/18/21 47.83 kg/m  07/07/21 47.61 kg/m  07/16/20 45.73 kg/m     Vaccines:  HPV: up to at age 10 , ask insurance if age between 90-45  Shingrix: 69-64 yo and ask insurance if covered when patient above 7 yo Pneumonia:  educated and discussed with patient. Flu:  educated and discussed with patient.  Hep C Screening: 05/31/2019 STD testing and  prevention (HIV/chl/gon/syphilis): 05/31/2019 Intimate partner violence:none Sexual History :sexually active Menstrual History/LMP/Abnormal Bleeding: currently pregnant with first pregnancy, [facility]:  irregular thinks it was 05/23/2021 Incontinence Symptoms: none  Breast cancer:  - Last Mammogram: pt states she has noticed some lumps in her right breast, no family history of breast cancer - BRCA gene screening: none  Osteoporosis: Discussed high calcium and vitamin D supplementation, weight bearing exercises  Cervical cancer screening: 01/30/2019  Skin cancer: Discussed monitoring for atypical lesions  Colorectal cancer: no concerns, does not qualify   Lung cancer:   Low Dose CT Chest recommended if Age 51-80 years, 20 pack-year currently smoking OR have quit w/in 15years. Patient does not qualify.   ECG: none  Lipids: Lab Results  Component Value Date   CHOL 204 (H) 05/31/2019   Lab Results  Component Value Date   HDL 50 05/31/2019   Lab Results  Component Value Date   LDLCALC 127 (H) 05/31/2019   Lab Results  Component Value Date   TRIG 151 (H) 05/31/2019   Lab Results  Component Value Date   CHOLHDL 4.1 05/31/2019   No results found for: "LDLDIRECT"  Glucose: Glucose  Date Value Ref Range Status  02/12/2020 87 65 - 99 mg/dL Final  05/31/2019 81 65 - 99 mg/dL Final  07/26/2012 119 (H) 65 - 99 mg/dL Final  01/04/2012 88 65 - 99 mg/dL Final   Glucose, Bld  Date Value Ref Range Status  05/23/2014 103 (H) 65 - 99 mg/dL Final    Patient Active Problem List   Diagnosis Date Noted   Vitamin D deficiency 03/15/2020   Elevated ferritin 03/15/2020   Elevated hemoglobin (HCC) 03/15/2020   Ear pressure, right 02/13/2020   Posterior cervical lymphadenopathy- right  02/13/2020   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   Obesity, Class III, BMI 40-49.9 (morbid obesity) ([place]) 10/15/2014    Past Surgical History:  Procedure Laterality Date   TONSILLECTOMY AND ADENOIDECTOMY      WISDOM TOOTH EXTRACTION      Family History  Problem Relation Age of Onset   Anxiety disorder Mother    Asthma Mother    Depression Mother    Glaucoma Mother    Sleep apnea Father    Heart murmur Father    Hypertension Father    Cervical cancer Maternal Aunt        early 75s   Lupus Paternal Aunt    Drug abuse Maternal Grandmother    Hypertension Paternal Grandmother    Skin cancer Paternal Grandfather    Congestive Heart Failure Paternal Grandfather    Sleep apnea Paternal Grandfather    Breast cancer Neg Hx     Social History   Socioeconomic History   Marital status: Single    Spouse name: Not on file   Number of children: Not on file   Years of education: Not on file   Highest education level: Not on file  Occupational History   Not on file  Tobacco Use   Smoking status: Never   Smokeless tobacco: Never  Vaping Use   Vaping Use: Never used  Substance and Sexual Activity   Alcohol use: Not Currently   Drug use: Never   Sexual activity: Yes    Birth control/protection: Implant  Other Topics Concern   Not on file  Social History Narrative   Not on file   Social Determinants of Health   Financial Resource Strain: Low Risk  (07/18/2021)   Overall Financial Resource Strain (CARDIA)    Difficulty of Paying Living Expenses: Not hard at all  Food Insecurity: No Food Insecurity (07/18/2021)   Hunger Vital Sign    Worried About Running Out of Food in the Last Year: Never true    Ran Out of Food in the Last Year: Never true  Transportation Needs: No Transportation Needs (07/18/2021)   PRAPARE - Hydrologist (Medical): No    Lack of Transportation (Non-Medical): No  Physical Activity: Inactive (07/18/2021)   Exercise Vital Sign    Days of Exercise per Week: 0 days    Minutes of Exercise per Session: 0 min  Stress: Stress Concern Present (07/18/2021)   [place]    Feeling  of Stress : To some extent  Social Connections: Socially Integrated (07/18/2021)   Social Connection and Isolation Panel [NHANES]    Frequency of Communication with Friends and Family: More than three times a week    Frequency of Social Gatherings with Friends and Family: More than three times a week    Attends Religious Services: More than 4 times per year    Active Member of [employer] or Organizations: Yes    Attends Archivist Meetings: More than 4 times per year    Marital  Status: Married  Human resources officer Violence: Not At Risk (07/18/2021)   Humiliation, Afraid, Rape, and Kick questionnaire    Fear of Current or Ex-Partner: No    Emotionally Abused: No    Physically Abused: No    Sexually Abused: No     Current Outpatient Medications:    Cholecalciferol (VITAMIN D) 125 MCG (5000 UT) CAPS, , Disp: , Rfl:    fluticasone (FLONASE) 50 MCG/ACT nasal spray, Place 2 sprays into both nostrils daily., Disp: 16 g, Rfl: 3   Prenatal MV-Min-Fe Fum-FA-DHA (PRENATAL 1 PO), , Disp: , Rfl:   Allergies  Allergen Reactions   Latex Rash     ROS  Constitutional: Negative for fever or weight change.  Respiratory: Negative for cough and shortness of breath.   Cardiovascular: Negative for chest pain or palpitations.  Gastrointestinal: Negative for abdominal pain, no bowel changes.  Musculoskeletal: Negative for gait problem or joint swelling.  Skin: Negative for rash.  Neurological: Negative for dizziness or headache.  No other specific complaints in a complete review of systems (except as listed in HPI above).   Objective  Vitals:   07/18/21 0806  BP: 124/76  Pulse: (!) 102  Resp: 16  Temp: 98.5 F (36.9 C)  TempSrc: Oral  SpO2: 98%  Weight: (!) 305 lb 6.4 oz (138.5 kg)  Height: 5' 7"  (1.702 m)    Body mass index is 47.83 kg/m.  Physical Exam  Constitutional: Patient appears well-developed and well-nourished. No distress.  HENT: Head: Normocephalic and atraumatic.  Ears: B TMs ok, no erythema or effusion; Nose: Nose normal. Mouth/Throat: Oropharynx is clear and moist. No oropharyngeal exudate.  Eyes: Conjunctivae and EOM are normal. Pupils are equal, round, and reactive to light. No scleral icterus.  Neck: Normal range of motion. Neck supple. No JVD present. No thyromegaly present.  Cardiovascular: Normal rate, regular rhythm and normal heart sounds.  No murmur heard. No BLE edema. Pulmonary/Chest: Effort normal and breath sounds normal. No respiratory distress. Abdominal: Soft. Bowel sounds are normal, no distension. There is no tenderness. no masses Breast: right breast there is approximately quarter sized lump at the 7 o'clock position Musculoskeletal: Normal range of motion, no joint effusions. No gross deformities Neurological: he is alert and oriented to person, place, and time. No cranial nerve deficit. Coordination, balance, strength, speech and gait are normal.  Skin: Skin is warm and dry. No rash noted. No erythema.  Psychiatric: Patient has a normal mood and affect. behavior is normal. Judgment and thought content normal.   Recent Results (from the past 2160 hour(s))  POCT urine pregnancy     Status: Abnormal   Collection Time: 07/07/21  3:48 PM  Result Value Ref Range   Preg Test, Ur Positive (A) Negative     Fall Risk:    07/18/2021    8:07 AM 05/29/2019   10:52 AM  Fall Risk   Falls in the past year? 0 0  Number falls in past yr: 0 0  Injury with Fall? 0 0  Follow up Falls evaluation completed      Functional Status Survey: Is the patient deaf or have difficulty hearing?: No Does the patient have difficulty seeing, even when wearing glasses/contacts?: No Does the patient have difficulty concentrating, remembering, or making decisions?: No Does the patient have difficulty walking or climbing stairs?: No Does the patient have difficulty dressing or bathing?: No Does the patient have difficulty doing errands alone such as  visiting a doctor's office or shopping?: No  Assessment & Plan  1. Annual physical exam  - Lipid panel - CBC with Differential/Platelet - COMPLETE METABOLIC PANEL WITH GFR - Hemoglobin A1c - VITAMIN D 25 Hydroxy (Vit-D Deficiency, Fractures) - Beta HCG, Quant  2. Screening for diabetes mellitus  - COMPLETE METABOLIC PANEL WITH GFR - Hemoglobin A1c  3. Screening for cholesterol level  - Lipid panel  4. Vitamin D deficiency  - VITAMIN D 25 Hydroxy (Vit-D Deficiency, Fractures)  5. Elevated hemoglobin (HCC)  - CBC with Differential/Platelet  6. Obesity, Class III, BMI 40-49.9 (morbid obesity) (HCC)  - Lipid panel - CBC with Differential/Platelet - COMPLETE METABOLIC PANEL WITH GFR - Hemoglobin A1c  7. [redacted] weeks gestation of pregnancy  - Beta HCG, Quant  8. Breast lump on right side at 7 o'clock position  - US BREAST LTD UNI RIGHT INC AXILLA; Future   -USPSTF grade A and B recommendations reviewed with patient; age-appropriate recommendations, preventive care, screening tests, etc discussed and encouraged; healthy living encouraged; see AVS for patient education given to patient -Discussed importance of 150 minutes of physical activity weekly, eat two servings of fish weekly, eat one serving of tree nuts ( cashews, pistachios, pecans, almonds.Marland Kitchen) every other day, eat 6 servings of fruit/vegetables daily and drink plenty of water and avoid sweet beverages.

## 2021-07-19 LAB — CBC WITH DIFFERENTIAL/PLATELET
Absolute Monocytes: 546 cells/uL (ref 200–950)
Basophils Absolute: 53 cells/uL (ref 0–200)
Basophils Relative: 0.5 %
Eosinophils Absolute: 105 cells/uL (ref 15–500)
Eosinophils Relative: 1 %
HCT: 41.4 % (ref 35.0–45.0)
Hemoglobin: 13.9 g/dL (ref 11.7–15.5)
Lymphs Abs: 2373 cells/uL (ref 850–3900)
MCH: 30.4 pg (ref 27.0–33.0)
MCHC: 33.6 g/dL (ref 32.0–36.0)
MCV: 90.6 fL (ref 80.0–100.0)
MPV: 10.9 fL (ref 7.5–12.5)
Monocytes Relative: 5.2 %
Neutro Abs: 7424 cells/uL (ref 1500–7800)
Neutrophils Relative %: 70.7 %
Platelets: 209 10*3/uL (ref 140–400)
RBC: 4.57 10*6/uL (ref 3.80–5.10)
RDW: 12.6 % (ref 11.0–15.0)
Total Lymphocyte: 22.6 %
WBC: 10.5 10*3/uL (ref 3.8–10.8)

## 2021-07-19 LAB — COMPLETE METABOLIC PANEL WITH GFR
AG Ratio: 1.3 (calc) (ref 1.0–2.5)
ALT: 13 U/L (ref 6–29)
AST: 14 U/L (ref 10–30)
Albumin: 4.1 g/dL (ref 3.6–5.1)
Alkaline phosphatase (APISO): 49 U/L (ref 31–125)
BUN: 9 mg/dL (ref 7–25)
CO2: 24 mmol/L (ref 20–32)
Calcium: 9.3 mg/dL (ref 8.6–10.2)
Chloride: 101 mmol/L (ref 98–110)
Creat: 0.7 mg/dL (ref 0.50–0.96)
Globulin: 3.1 g/dL (calc) (ref 1.9–3.7)
Glucose, Bld: 78 mg/dL (ref 65–99)
Potassium: 3.6 mmol/L (ref 3.5–5.3)
Sodium: 137 mmol/L (ref 135–146)
Total Bilirubin: 0.4 mg/dL (ref 0.2–1.2)
Total Protein: 7.2 g/dL (ref 6.1–8.1)
eGFR: 122 mL/min/{1.73_m2} (ref >=60)

## 2021-07-19 LAB — VITAMIN D 25 HYDROXY (VIT D DEFICIENCY, FRACTURES): Vit D, 25-Hydroxy: 22 ng/mL — ABNORMAL LOW (ref 30–100)

## 2021-07-19 LAB — LIPID PANEL
Cholesterol: 201 mg/dL — ABNORMAL HIGH (ref <200)
HDL: 76 mg/dL (ref >=50)
LDL Cholesterol (Calc): 105 mg/dL (calc) — ABNORMAL HIGH
Non-HDL Cholesterol (Calc): 125 mg/dL (calc) (ref <130)
Total CHOL/HDL Ratio: 2.6 (calc) (ref <5.0)
Triglycerides: 103 mg/dL (ref <150)

## 2021-07-19 LAB — HEMOGLOBIN A1C
Hgb A1c MFr Bld: 4.9 % of total Hgb (ref <5.7)
Mean Plasma Glucose: 94 mg/dL
eAG (mmol/L): 5.2 mmol/L

## 2021-07-19 LAB — HCG, TOTAL, QUANTITATIVE: hCG, Beta Chain, Quant, S: 103942 m[IU]/mL — ABNORMAL HIGH

## 2021-07-29 ENCOUNTER — Ambulatory Visit: Payer: 59 | Admitting: Obstetrics and Gynecology

## 2021-07-30 ENCOUNTER — Ambulatory Visit
Admission: RE | Admit: 2021-07-30 | Discharge: 2021-07-30 | Disposition: A | Discharge status: Home / Self Care | Payer: 59 | Source: Ambulatory Visit | Attending: Nurse Practitioner | Admitting: Nurse Practitioner

## 2021-07-30 DIAGNOSIS — N6313 Unspecified lump in the right breast, lower outer quadrant: Secondary | ICD-10-CM | POA: Insufficient documentation

## 2021-08-01 ENCOUNTER — Ambulatory Visit (INDEPENDENT_AMBULATORY_CARE_PROVIDER_SITE_OTHER): Payer: 59

## 2021-08-01 VITALS — Wt 307.0 lb

## 2021-08-01 DIAGNOSIS — Z3A00 Weeks of gestation of pregnancy not specified: Secondary | ICD-10-CM

## 2021-08-01 DIAGNOSIS — Z3400 Encounter for supervision of normal first pregnancy, unspecified trimester: Secondary | ICD-10-CM

## 2021-08-01 DIAGNOSIS — Z3480 Encounter for supervision of other normal pregnancy, unspecified trimester: Secondary | ICD-10-CM | POA: Insufficient documentation

## 2021-08-01 NOTE — Progress Notes (Signed)
New OB Intake  I connected with  Katherine Rivera on 08/01/21 at  8:15 AM EDT by telephone Video Visit and verified that I am speaking with the correct person using two identifiers. Nurse is located at [facility] and pt is located at home.  I explained I am completing New OB Intake today. We discussed her EDD of 02/27/2022 that is based on LMP of approx 05/23/21. Pt is G1/P0. I reviewed her allergies, medications, Medical/Surgical/OB history, and appropriate screenings. Based on history, this is a/an pregnancy uncomplicated .   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  Also of note, Pt's brother has congenital heart defects.  Delivery Plans:  Plans to deliver at [facility]. Pt states she would rather deliver in [place]; adv if she did she would have someone deliver her that she hasn't met before.  Anatomy US Explained we will get her scheduled for a dating scan and she will have an anatomy scan at 20 weeks.  Labs Discussed genetic screening with patient. Patient desires genetic testing to be drawn with new OB lab. Discussed possible labs to be drawn at new OB appointment.  COVID Vaccine Patient has had COVID vaccine.   Social Determinants of Health Food Insecurity: denies food insecurity Transportation: Patient denies  transportation needs.  First visit review I reviewed new OB appt with pt. I explained she will have ob bloodwork and pap smear/pelvic exam if indicated. Explained pt will be seen by Lydia Dominic, CNM at first visit; encounter routed to appropriate provider.   Rita Johnson, CMA 08/01/2021  8:37 AM  Clinical Staff Provider  Office Location  [facility] Dating    Language  English Anatomy US    Flu Vaccine  work Genetic Screen  NIPS:   TDaP vaccine   work Hgb A1C or  GTT Early : Third trimester :   Covid No boosters   LAB RESULTS   Rhogam   Blood Type     Feeding Plan Breast Antibody    Contraception undecided Rubella    Circumcision undecided RPR     Pediatrician  undecided HBsAg     Support Person David HIV    Prenatal Classes yes Varicella     GBS  (For PCN allergy, check sensitivities)   BTL Consent  Hep C   VBAC Consent  Pap      Hgb Electro      CF      SMA          

## 2021-08-08 ENCOUNTER — Other Ambulatory Visit: Payer: 59

## 2021-08-08 DIAGNOSIS — Z3480 Encounter for supervision of other normal pregnancy, unspecified trimester: Secondary | ICD-10-CM | POA: Diagnosis not present

## 2021-08-12 LAB — CBC/D/PLT+RPR+RH+ABO+RUBIGG...
Antibody Screen: NEGATIVE
Basophils Absolute: 0.1 10*3/uL (ref 0.0–0.2)
Basos: 1 %
EOS (ABSOLUTE): 0.1 10*3/uL (ref 0.0–0.4)
Eos: 1 %
HCV Ab: NONREACTIVE
HIV Screen 4th Generation wRfx: NONREACTIVE
Hematocrit: 40.5 % (ref 34.0–46.6)
Hemoglobin: 13.7 g/dL (ref 11.1–15.9)
Hepatitis B Surface Ag: NEGATIVE
Immature Grans (Abs): 0 10*3/uL (ref 0.0–0.1)
Immature Granulocytes: 0 %
Lymphocytes Absolute: 2.3 10*3/uL (ref 0.7–3.1)
Lymphs: 23 %
MCH: 30 pg (ref 26.6–33.0)
MCHC: 33.8 g/dL (ref 31.5–35.7)
MCV: 89 fL (ref 79–97)
Monocytes Absolute: 0.4 10*3/uL (ref 0.1–0.9)
Monocytes: 4 %
Neutrophils Absolute: 7.2 10*3/uL — ABNORMAL HIGH (ref 1.4–7.0)
Neutrophils: 71 %
Platelets: 236 10*3/uL (ref 150–450)
RBC: 4.57 x10E6/uL (ref 3.77–5.28)
RDW: 12.6 % (ref 11.7–15.4)
RPR Ser Ql: NONREACTIVE
Rh Factor: POSITIVE
Rubella Antibodies, IGG: 1.22 index (ref >0.99)
Varicella zoster IgG: 1826 index (ref >165)
WBC: 10.1 10*3/uL (ref 3.4–10.8)

## 2021-08-12 LAB — HCV INTERPRETATION

## 2021-08-12 NOTE — Addendum Note (Signed)
Addended by: Cleophas Dunker D on: 08/12/2021 04:01 PM   Modules accepted: Orders

## 2021-08-14 ENCOUNTER — Ambulatory Visit: Payer: 59

## 2021-08-14 ENCOUNTER — Ambulatory Visit: Payer: 59 | Admitting: Obstetrics and Gynecology

## 2021-08-14 DIAGNOSIS — Z3480 Encounter for supervision of other normal pregnancy, unspecified trimester: Secondary | ICD-10-CM

## 2021-08-14 LAB — SPECIMEN STATUS REPORT

## 2021-08-14 LAB — MATERNIT 21 PLUS CORE, BLOOD
Fetal Fraction: 5
Result (T21): NEGATIVE
Trisomy 13 (Patau syndrome): NEGATIVE
Trisomy 18 (Edwards syndrome): NEGATIVE
Trisomy 21 (Down syndrome): NEGATIVE

## 2021-08-15 ENCOUNTER — Encounter: Payer: 59 | Admitting: Licensed Practical Nurse

## 2021-08-19 ENCOUNTER — Other Ambulatory Visit: Payer: 59

## 2021-08-20 ENCOUNTER — Other Ambulatory Visit: Payer: Self-pay

## 2021-08-20 ENCOUNTER — Emergency Department
Admission: EM | Admit: 2021-08-20 | Discharge: 2021-08-20 | Disposition: A | Discharge status: Home / Self Care | Payer: 59 | Attending: Emergency Medicine | Admitting: Emergency Medicine

## 2021-08-20 ENCOUNTER — Emergency Department: Payer: 59

## 2021-08-20 ENCOUNTER — Encounter: Payer: Self-pay | Admitting: Emergency Medicine

## 2021-08-20 DIAGNOSIS — E876 Hypokalemia: Secondary | ICD-10-CM | POA: Insufficient documentation

## 2021-08-20 DIAGNOSIS — D72829 Elevated white blood cell count, unspecified: Secondary | ICD-10-CM | POA: Insufficient documentation

## 2021-08-20 DIAGNOSIS — N3000 Acute cystitis without hematuria: Secondary | ICD-10-CM | POA: Diagnosis not present

## 2021-08-20 DIAGNOSIS — O99111 Other diseases of the blood and blood-forming organs and certain disorders involving the immune mechanism complicating pregnancy, first trimester: Secondary | ICD-10-CM | POA: Insufficient documentation

## 2021-08-20 DIAGNOSIS — O26891 Other specified pregnancy related conditions, first trimester: Secondary | ICD-10-CM | POA: Diagnosis not present

## 2021-08-20 DIAGNOSIS — O2311 Infections of bladder in pregnancy, first trimester: Secondary | ICD-10-CM | POA: Diagnosis not present

## 2021-08-20 DIAGNOSIS — Z3A12 12 weeks gestation of pregnancy: Secondary | ICD-10-CM | POA: Diagnosis not present

## 2021-08-20 DIAGNOSIS — O99281 Endocrine, nutritional and metabolic diseases complicating pregnancy, first trimester: Secondary | ICD-10-CM | POA: Insufficient documentation

## 2021-08-20 DIAGNOSIS — R1030 Lower abdominal pain, unspecified: Secondary | ICD-10-CM | POA: Diagnosis not present

## 2021-08-20 LAB — CBC WITH DIFFERENTIAL/PLATELET
Abs Immature Granulocytes: 0.04 10*3/uL (ref 0.00–0.07)
Basophils Absolute: 0.1 10*3/uL (ref 0.0–0.1)
Basophils Relative: 1 %
Eosinophils Absolute: 0.1 10*3/uL (ref 0.0–0.5)
Eosinophils Relative: 1 %
HCT: 42.4 % (ref 36.0–46.0)
Hemoglobin: 14.5 g/dL (ref 12.0–15.0)
Immature Granulocytes: 0 %
Lymphocytes Relative: 25 %
Lymphs Abs: 2.7 10*3/uL (ref 0.7–4.0)
MCH: 29.7 pg (ref 26.0–34.0)
MCHC: 34.2 g/dL (ref 30.0–36.0)
MCV: 86.9 fL (ref 80.0–100.0)
Monocytes Absolute: 0.5 10*3/uL (ref 0.1–1.0)
Monocytes Relative: 5 %
Neutro Abs: 7.2 10*3/uL (ref 1.7–7.7)
Neutrophils Relative %: 68 %
Platelets: 237 10*3/uL (ref 150–400)
RBC: 4.88 MIL/uL (ref 3.87–5.11)
RDW: 11.9 % (ref 11.5–15.5)
WBC: 10.6 10*3/uL — ABNORMAL HIGH (ref 4.0–10.5)
nRBC: 0 % (ref 0.0–0.2)

## 2021-08-20 LAB — URINALYSIS, ROUTINE W REFLEX MICROSCOPIC
Bilirubin Urine: NEGATIVE
Glucose, UA: NEGATIVE mg/dL
Hgb urine dipstick: NEGATIVE
Ketones, ur: 20 mg/dL — AB
Nitrite: NEGATIVE
Protein, ur: 30 mg/dL — AB
Specific Gravity, Urine: 1.028 (ref 1.005–1.030)
WBC, UA: >50 WBC/hpf — ABNORMAL HIGH (ref 0–5)
pH: 5 (ref 5.0–8.0)

## 2021-08-20 LAB — SAMPLE TO BLOOD BANK

## 2021-08-20 LAB — LIPASE, BLOOD: Lipase: 32 U/L (ref 11–51)

## 2021-08-20 LAB — COMPREHENSIVE METABOLIC PANEL
ALT: 16 U/L (ref 0–44)
AST: 18 U/L (ref 15–41)
Albumin: 3.7 g/dL (ref 3.5–5.0)
Alkaline Phosphatase: 48 U/L (ref 38–126)
Anion gap: 12 (ref 5–15)
BUN: 14 mg/dL (ref 6–20)
CO2: 22 mmol/L (ref 22–32)
Calcium: 9.4 mg/dL (ref 8.9–10.3)
Chloride: 103 mmol/L (ref 98–111)
Creatinine, Ser: 0.81 mg/dL (ref 0.44–1.00)
GFR, Estimated: >60 mL/min (ref >60)
Glucose, Bld: 97 mg/dL (ref 70–99)
Potassium: 3.3 mmol/L — ABNORMAL LOW (ref 3.5–5.1)
Sodium: 137 mmol/L (ref 135–145)
Total Bilirubin: 0.6 mg/dL (ref 0.3–1.2)
Total Protein: 7.9 g/dL (ref 6.5–8.1)

## 2021-08-20 LAB — HCG, QUANTITATIVE, PREGNANCY: hCG, Beta Chain, Quant, S: 35405 m[IU]/mL — ABNORMAL HIGH (ref <5)

## 2021-08-20 LAB — PREGNANCY, URINE: Preg Test, Ur: POSITIVE — AB

## 2021-08-20 MED ORDER — CEPHALEXIN 500 MG PO CAPS
500.0000 mg | ORAL_CAPSULE | Freq: Four times a day (QID) | ORAL | 0 refills | Status: AC
  Filled 2021-08-20: qty 20, 5d supply, fill #0

## 2021-08-20 MED ORDER — LACTATED RINGERS IV BOLUS
1000.0000 mL | Freq: Once | INTRAVENOUS | Status: AC
  Administered 2021-08-20: 1000 mL via INTRAVENOUS

## 2021-08-20 MED ORDER — FLUCONAZOLE 150 MG PO TABS
150.0000 mg | ORAL_TABLET | Freq: Once | ORAL | 0 refills | Status: AC
  Filled 2021-08-20: qty 1, 1d supply, fill #0

## 2021-08-20 MED ORDER — SODIUM CHLORIDE 0.9 % IV SOLN
2.0000 g | Freq: Once | INTRAVENOUS | Status: AC
  Administered 2021-08-20: 2 g via INTRAVENOUS
  Filled 2021-08-20: qty 20

## 2021-08-20 MED ORDER — ACETAMINOPHEN 500 MG PO TABS
1000.0000 mg | ORAL_TABLET | Freq: Once | ORAL | Status: AC
  Administered 2021-08-20: 1000 mg via ORAL
  Filled 2021-08-20: qty 2

## 2021-08-20 NOTE — ED Triage Notes (Signed)
Patient reports she is approximately [redacted] weeks pregnant and began having uterine cramping while working approximately one hour forty five minutes ago. Denies vaginal bleeding or spotting.

## 2021-08-20 NOTE — ED Provider Notes (Signed)
[facility] Provider Note    Event Date/Time   First MD Initiated Contact with Patient 08/20/21 [phone]     (approximate)   History   Abdominal Cramping   HPI  Katherine Rivera is a 26 y.o. female who presents to the ED for evaluation of Abdominal Cramping    I reviewed PCP visit from 6/30.  Obese patient.  She self-reports a history of situs inversus.  Patient presents to the ED for the evaluation of lower abdominal cramping pain.  She reports that she is [redacted] weeks pregnant by LMP and has not seen OB/GYN yet.  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  Reports the sensation is similar as period cramps, but more severe intensity.  Denies any vaginal bleeding, emesis, loss of fluids or novel vaginal discharge.  Reports she had a 1 hour long episode of lower abdominal cramping yesterday that was of a similar quality and self resolved.   Physical Exam   Triage Vital Signs: ED Triage Vitals [08/20/21 0139]  Enc Vitals Group     BP (!) 153/78     Pulse Rate (!) 133     Resp (!) 22     Temp 98 F (36.7 C)     Temp Source Oral     SpO2 98 %     Weight      Height      Head Circumference      Peak Flow      Pain Score      Pain Loc      Pain Edu?      Excl. in [place]?     Most recent vital signs: Vitals:   08/20/21 0300 08/20/21 0542  BP: (!) 128/55 (!) 127/58  Pulse: 95 97  Resp: 20 20  Temp:  98.2 F (36.8 C)  SpO2: 100% 99%    General: Awake, no distress.  Obese.  Seems uncomfortable. CV:  Good peripheral perfusion.  Resp:  Normal effort.  Abd:  No distention.  Suprapubic and LLQ tenderness without peritoneal features.  Upper abdomen is benign. MSK:  No deformity noted.  Neuro:  No focal deficits appreciated. Other:     ED Results / Procedures / Treatments   Labs (all labs ordered are  listed, but only abnormal results are displayed) Labs Reviewed  HCG, QUANTITATIVE, PREGNANCY - Abnormal; Notable for the following components:      Result Value   hCG, Beta Chain, Quant, S 35,405 (*)    All other components within normal limits  CBC WITH DIFFERENTIAL/PLATELET - Abnormal; Notable for the following components:   WBC 10.6 (*)    All other components within normal limits  COMPREHENSIVE METABOLIC PANEL - Abnormal; Notable for the following components:   Potassium 3.3 (*)    All other components within normal limits  URINALYSIS, ROUTINE W REFLEX MICROSCOPIC - Abnormal; Notable for the following components:   Color, Urine YELLOW (*)    APPearance HAZY (*)    Ketones, ur 20 (*)    Protein, ur 30 (*)    Leukocytes,Ua MODERATE (*)    WBC, UA >50 (*)    Bacteria, UA RARE (*)    All other components within normal limits  PREGNANCY, URINE - Abnormal; Notable for the following components:   Preg Test, Ur POSITIVE (*)  All other components within normal limits  URINE CULTURE  LIPASE, BLOOD  SAMPLE TO BLOOD BANK    EKG   RADIOLOGY Obstetric ultrasound interpreted by me with IUP  Official radiology report(s): US OB Comp Less 14 Wks  Result Date: 08/20/2021 CLINICAL DATA:  Lower abdominal cramping.  First trimester pregnancy EXAM: OBSTETRIC <14 WK ULTRASOUND TECHNIQUE: Transabdominal ultrasound was performed for evaluation of the gestation as well as the maternal uterus and adnexal regions. COMPARISON:  None Available. FINDINGS: Intrauterine gestational sac: Single Yolk sac:  Not seen Embryo:  Visualized. Cardiac Activity: Visualized. Heart Rate: 158 bpm CRL:   62 mm   12 w 5 d                  [place] EDC: 02/27/2022 Subchorionic hemorrhage:  None visualized. Maternal uterus/adnexae: Normal appearance of the ovaries. IMPRESSION: Single living intrauterine pregnancy measuring 12 weeks 5 days. No unexpected finding. Electronically Signed   By: Jorje Guild M.D.   On: 08/20/2021  06:04    PROCEDURES and INTERVENTIONS:  Procedures  Medications  lactated ringers bolus 1,000 mL (0 mLs Intravenous Stopped 08/20/21 0532)  acetaminophen (TYLENOL) tablet 1,000 mg (1,000 mg Oral Given 08/20/21 0210)  cefTRIAXone (ROCEPHIN) 2 g in sodium chloride 0.9 % 100 mL IVPB (0 g Intravenous Stopped 08/20/21 0410)     IMPRESSION / MDM / ASSESSMENT AND PLAN / ED COURSE  I reviewed the triage vital signs and the nursing notes.  Differential diagnosis includes, but is not limited to, acute cystitis, subchorionic hemorrhage, miscarriage, appendicitis, diverticulitis, sepsis  {Patient presents with symptoms of an acute illness or injury that is potentially life-threatening.  26 year old G1 at [redacted] weeks gestation presents with lower abdominal cramping and pain, with evidence of acute cystitis suitable for outpatient management with initiation of antibiotics.  Blood work with minimal hypokalemia that is repleted orally.  CBC with marginal leukocytosis.  No signs of sepsis.  hCG is appropriately elevated for gestational age based off LMP.  Urine with infectious features and will be sent for culture.  She is started on antibiotics.  Ketonuria suggestive of dehydration for which she receives IV fluids.  Reassuring obstetric ultrasound.  No barriers to outpatient management.  We discussed MRI to assess for other intra-abdominal pathology such as appendicitis, but she declines well expressing understanding of the risks and benefits.  Discharged with antibiotics and return precautions.  Clinical Course as of 08/20/21 0724  Wed Aug 20, 2021  0300 Reassessed and reviewed results with the patient.  We discussed signs of acute cystitis, dehydration and pending ultrasound.  We again discussed the possibility of MRI to assess for appendicitis and she is hesitant to do so and is leaning towards not performing the study, acknowledging the unlikely risk of untreated appendicitis. [DS]  [id number] Reassessed.  Patient  reports feeling better.  We discussed reassuring ultrasound.  We discussed work-up overall and possible etiologies of her pain.  Discussed antibiotics.  She requests Diflucan.  We discussed return precautions. [DS]    Clinical Course User Index [DS] Vladimir Crofts, MD     FINAL CLINICAL IMPRESSION(S) / ED DIAGNOSES   Final diagnoses:  Abdominal pain during pregnancy in first trimester  Acute cystitis without hematuria     Rx / DC Orders   ED Discharge Orders          Ordered    cephALEXin (KEFLEX) 500 MG capsule  4 times daily        08/20/21 0617  fluconazole (DIFLUCAN) 150 MG tablet   Once        08/20/21 3112             Note:  This document was prepared using Dragon voice recognition software and may include unintentional dictation errors.   Vladimir Crofts, MD 08/20/21 [phone]

## 2021-08-20 NOTE — Discharge Instructions (Signed)
Use Tylenol for pain and fevers.  Up to 1000 mg per dose, up to 4 times per day.  Do not take more than 4000 mg of Tylenol/acetaminophen within 24 hours..  

## 2021-08-21 ENCOUNTER — Other Ambulatory Visit: Payer: Self-pay | Admitting: Licensed Practical Nurse

## 2021-08-21 ENCOUNTER — Ambulatory Visit (INDEPENDENT_AMBULATORY_CARE_PROVIDER_SITE_OTHER): Payer: 59

## 2021-08-21 DIAGNOSIS — Z3A13 13 weeks gestation of pregnancy: Secondary | ICD-10-CM

## 2021-08-21 DIAGNOSIS — Z3481 Encounter for supervision of other normal pregnancy, first trimester: Secondary | ICD-10-CM | POA: Diagnosis not present

## 2021-08-21 DIAGNOSIS — Z3480 Encounter for supervision of other normal pregnancy, unspecified trimester: Secondary | ICD-10-CM

## 2021-08-22 LAB — URINE CULTURE: Culture: 20000 — AB

## 2021-08-25 ENCOUNTER — Encounter: Payer: 59 | Admitting: Obstetrics & Gynecology

## 2021-09-09 DIAGNOSIS — O0990 Supervision of high risk pregnancy, unspecified, unspecified trimester: Secondary | ICD-10-CM | POA: Insufficient documentation

## 2021-09-09 DIAGNOSIS — O0992 Supervision of high risk pregnancy, unspecified, second trimester: Secondary | ICD-10-CM | POA: Insufficient documentation

## 2021-09-11 DIAGNOSIS — Z1329 Encounter for screening for other suspected endocrine disorder: Secondary | ICD-10-CM | POA: Diagnosis not present

## 2021-09-11 DIAGNOSIS — Z8279 Family history of other congenital malformations, deformations and chromosomal abnormalities: Secondary | ICD-10-CM | POA: Insufficient documentation

## 2021-09-11 DIAGNOSIS — O99212 Obesity complicating pregnancy, second trimester: Secondary | ICD-10-CM | POA: Diagnosis not present

## 2021-09-11 DIAGNOSIS — O0992 Supervision of high risk pregnancy, unspecified, second trimester: Secondary | ICD-10-CM | POA: Diagnosis not present

## 2021-09-11 DIAGNOSIS — B9620 Unspecified Escherichia coli [E. coli] as the cause of diseases classified elsewhere: Secondary | ICD-10-CM | POA: Diagnosis not present

## 2021-09-11 DIAGNOSIS — Q893 Situs inversus: Secondary | ICD-10-CM | POA: Diagnosis not present

## 2021-09-11 DIAGNOSIS — Z131 Encounter for screening for diabetes mellitus: Secondary | ICD-10-CM | POA: Diagnosis not present

## 2021-09-11 DIAGNOSIS — Z3402 Encounter for supervision of normal first pregnancy, second trimester: Secondary | ICD-10-CM | POA: Diagnosis not present

## 2021-09-11 DIAGNOSIS — N390 Urinary tract infection, site not specified: Secondary | ICD-10-CM | POA: Diagnosis not present

## 2021-09-12 ENCOUNTER — Other Ambulatory Visit: Payer: Self-pay

## 2021-09-12 DIAGNOSIS — Z363 Encounter for antenatal screening for malformations: Secondary | ICD-10-CM

## 2021-09-12 MED ORDER — FOLIC ACID 1 MG PO TABS
ORAL_TABLET | ORAL | 11 refills | Status: DC
  Filled 2021-09-12: qty 30, 30d supply, fill #0
  Filled 2021-10-11: qty 30, 30d supply, fill #1
  Filled 2021-11-10: qty 30, 30d supply, fill #2
  Filled 2021-12-13: qty 30, 30d supply, fill #3
  Filled 2022-01-16 (×2): qty 30, 30d supply, fill #4

## 2021-09-18 ENCOUNTER — Other Ambulatory Visit: Payer: Self-pay

## 2021-09-18 MED ORDER — ONDANSETRON HCL 4 MG PO TABS
ORAL_TABLET | ORAL | 3 refills | Status: DC
  Filled 2021-09-18: qty 30, 15d supply, fill #0
  Filled 2021-10-11: qty 30, 15d supply, fill #1
  Filled 2021-11-10: qty 30, 15d supply, fill #2
  Filled 2021-12-13: qty 30, 15d supply, fill #3

## 2021-10-03 ENCOUNTER — Other Ambulatory Visit: Payer: Self-pay | Admitting: *Deleted

## 2021-10-03 ENCOUNTER — Ambulatory Visit: Payer: 59

## 2021-10-03 ENCOUNTER — Ambulatory Visit (HOSPITAL_BASED_OUTPATIENT_CLINIC_OR_DEPARTMENT_OTHER): Payer: 59 | Admitting: Maternal & Fetal Medicine

## 2021-10-03 ENCOUNTER — Ambulatory Visit: Payer: 59 | Admitting: *Deleted

## 2021-10-03 ENCOUNTER — Encounter: Payer: Self-pay | Admitting: *Deleted

## 2021-10-03 VITALS — BP 127/76 | HR 97

## 2021-10-03 DIAGNOSIS — Z3A19 19 weeks gestation of pregnancy: Secondary | ICD-10-CM

## 2021-10-03 DIAGNOSIS — Z8249 Family history of ischemic heart disease and other diseases of the circulatory system: Secondary | ICD-10-CM | POA: Insufficient documentation

## 2021-10-03 DIAGNOSIS — O99212 Obesity complicating pregnancy, second trimester: Secondary | ICD-10-CM | POA: Diagnosis not present

## 2021-10-03 DIAGNOSIS — O99891 Other specified diseases and conditions complicating pregnancy: Secondary | ICD-10-CM | POA: Diagnosis not present

## 2021-10-03 DIAGNOSIS — Z363 Encounter for antenatal screening for malformations: Secondary | ICD-10-CM | POA: Diagnosis not present

## 2021-10-03 DIAGNOSIS — E669 Obesity, unspecified: Secondary | ICD-10-CM

## 2021-10-03 DIAGNOSIS — Q893 Situs inversus: Secondary | ICD-10-CM

## 2021-10-03 NOTE — Progress Notes (Signed)
MFM Consultation  Katherine Rivera is a G1P0 who is at 54w0dwith an edd of 02/27/22 she is seen today at the request of JKerry Hough NP regarding concerns of the patient brother with congenital heart disease.  In addition other issues during the pregnancy include 1) BMI >40  2) Maternal history of situs inversus  She is overall doing well today without s/sx of hypertension, or preterm labor.  She has a low risk NIPS.  BP today was 127/76  OB History  Gravida Para Term Preterm AB Living  1 0 0 0 0 0  SAB IAB Ectopic Multiple Live Births  0 0 0 0 0    # Outcome Date GA Lbr Len/2nd Weight Sex Delivery Anes PTL Lv  1 Current              Past Medical History:  Diagnosis Date   Allergy    Situs inversus    Vaccine for human papilloma virus (HPV) types 6, 125 164 and 18 administered    Past Surgical History:  Procedure Laterality Date   TONSILLECTOMY AND ADENOIDECTOMY     WISDOM TOOTH EXTRACTION     Family History  Problem Relation Age of Onset   Anxiety disorder Mother    Asthma Mother    Depression Mother    Glaucoma Mother    Sleep apnea Father    Heart murmur Father    Hypertension Father    Congenital heart disease Brother    Drug abuse Maternal Grandmother    Hypertension Paternal Grandmother    Skin cancer Paternal Grandfather    Congestive Heart Failure Paternal Grandfather    Sleep apnea Paternal Grandfather    Cervical cancer Maternal Aunt        early 258s  Lupus Paternal Aunt    Breast cancer Neg Hx       Current Outpatient Medications (Respiratory):    fluticasone (FLONASE) 50 MCG/ACT nasal spray, Place 2 sprays into both nostrils daily. (Patient not taking: Reported on 08/01/2021)  Current Outpatient Medications (Analgesics):    aspirin EC 81 MG tablet, Take 81 mg by mouth daily. Swallow whole.  Current Outpatient Medications (Hematological):    folic acid (FOLVITE) 1 MG tablet, Take 1 tablet (1 mg total) by mouth once daily  Current Outpatient  Medications (Other):    Cholecalciferol (VITAMIN D) 125 MCG (5000 UT) CAPS,    ondansetron (ZOFRAN) 4 MG tablet, Take 1 tablet (4 mg total) by mouth every 12 (twelve) hours as needed for Nausea   Prenatal MV-Min-Fe Fum-FA-DHA (PRENATAL 1 PO),   Family History  Problem Relation Age of Onset   Anxiety disorder Mother    Asthma Mother    Depression Mother    Glaucoma Mother    Sleep apnea Father    Heart murmur Father    Hypertension Father    Congenital heart disease Brother    Drug abuse Maternal Grandmother    Hypertension Paternal Grandmother    Skin cancer Paternal Grandfather    Congestive Heart Failure Paternal Grandfather    Sleep apnea Paternal Grandfather    Cervical cancer Maternal Aunt        early 219s  Lupus Paternal Aunt    Breast cancer Neg Hx    Social Connections: Moderately Integrated (08/01/2021)   Social Connection and Isolation Panel [NHANES]    Frequency of Communication with Friends and Family: More than three times a week    Frequency of Social Gatherings with Friends and  Family: Once a week    Attends Religious Services: 1 to 4 times per year    Active Member of Clubs or Organizations: No    Attends Archivist Meetings: Never    Marital Status: Married   Allergies  Allergen Reactions   Latex Rash   Imaging: A single intrauterine pregnancy was observed with measurements consistent with dates. A detailed exam was performed and there were no markers of aneuploidy observed. Suboptimal views of the fetal anatomy was observed secondary to fetal position and maternal BMI Good fetal movement and amniotic fluid was observed.   Impression/Counseling:  1) Maternal Brother with CHD: I discussed with Katherine Rivera that the fetal heart was not well seen as documented above. However, there were no obvious anomalies seen.  In addition we dicussed the recurrence risk of 1-3% when a first or second degree relative is affected and therefore, a screening  fetal echocardiogram is recommended.  She reports that this study has been scheduled.   If the echocardiogram is normal we recommend serial growth due to elevate BMI > 40.  2) Maternal history of situs inversus  Katherine Rivera has situs inversus which is defined as Personal assistant of typical orientation of major organs.  This is typically autosomal recessive inheritance pattern but there are studies of X-linked as well as Autosomal inheritance patterns.It can be linked to congenital heart defects and genetic syndromes or isolated.   The incidence is typically 1:10000.   I recommended genetic counseling which she excepted.  3) BMI >40  I discussed the increased risk for gestational diabetes, preeclampsia, cesarean delivery, thrombosis and infection.   She is taking baby aspirin for preeclampsia prevention, in addition we recommend base line labs and early glucola screening.   We discussed weight gain goal of 11-20 lbs.   Lastly we recommend serial growth exams throughout the pregnancy every 4-6 weeks and to initiate weekly testing at 36  weeks.  I spent 30 minutes with > 50% in face to face consultation  All questions answered  Vikki Ports, MD

## 2021-10-09 DIAGNOSIS — N390 Urinary tract infection, site not specified: Secondary | ICD-10-CM | POA: Diagnosis not present

## 2021-10-09 DIAGNOSIS — Z1329 Encounter for screening for other suspected endocrine disorder: Secondary | ICD-10-CM | POA: Diagnosis not present

## 2021-10-09 DIAGNOSIS — B9620 Unspecified Escherichia coli [E. coli] as the cause of diseases classified elsewhere: Secondary | ICD-10-CM | POA: Diagnosis not present

## 2021-10-09 DIAGNOSIS — Z131 Encounter for screening for diabetes mellitus: Secondary | ICD-10-CM | POA: Diagnosis not present

## 2021-10-09 DIAGNOSIS — O99212 Obesity complicating pregnancy, second trimester: Secondary | ICD-10-CM | POA: Diagnosis not present

## 2021-10-12 ENCOUNTER — Other Ambulatory Visit: Payer: Self-pay

## 2021-10-13 ENCOUNTER — Other Ambulatory Visit: Payer: Self-pay

## 2021-10-27 DIAGNOSIS — Z8279 Family history of other congenital malformations, deformations and chromosomal abnormalities: Secondary | ICD-10-CM | POA: Diagnosis not present

## 2021-10-27 DIAGNOSIS — O99212 Obesity complicating pregnancy, second trimester: Secondary | ICD-10-CM | POA: Diagnosis not present

## 2021-10-27 DIAGNOSIS — Q893 Situs inversus: Secondary | ICD-10-CM | POA: Diagnosis not present

## 2021-10-31 ENCOUNTER — Ambulatory Visit: Payer: 59 | Admitting: *Deleted

## 2021-10-31 ENCOUNTER — Ambulatory Visit: Payer: 59 | Attending: Maternal & Fetal Medicine

## 2021-10-31 VITALS — BP 120/78 | HR 111

## 2021-10-31 DIAGNOSIS — O99891 Other specified diseases and conditions complicating pregnancy: Secondary | ICD-10-CM | POA: Diagnosis not present

## 2021-10-31 DIAGNOSIS — Z3A23 23 weeks gestation of pregnancy: Secondary | ICD-10-CM | POA: Diagnosis not present

## 2021-10-31 DIAGNOSIS — O99212 Obesity complicating pregnancy, second trimester: Secondary | ICD-10-CM

## 2021-10-31 DIAGNOSIS — E669 Obesity, unspecified: Secondary | ICD-10-CM

## 2021-10-31 DIAGNOSIS — Q893 Situs inversus: Secondary | ICD-10-CM

## 2021-11-03 ENCOUNTER — Other Ambulatory Visit: Payer: Self-pay | Admitting: *Deleted

## 2021-11-03 DIAGNOSIS — Q893 Situs inversus: Secondary | ICD-10-CM

## 2021-11-10 ENCOUNTER — Other Ambulatory Visit: Payer: Self-pay

## 2021-12-05 ENCOUNTER — Ambulatory Visit: Payer: 59

## 2021-12-05 DIAGNOSIS — O0992 Supervision of high risk pregnancy, unspecified, second trimester: Secondary | ICD-10-CM | POA: Diagnosis not present

## 2021-12-05 DIAGNOSIS — Z23 Encounter for immunization: Secondary | ICD-10-CM | POA: Diagnosis not present

## 2021-12-05 DIAGNOSIS — O99213 Obesity complicating pregnancy, third trimester: Secondary | ICD-10-CM | POA: Diagnosis not present

## 2021-12-15 ENCOUNTER — Other Ambulatory Visit: Payer: Self-pay

## 2022-01-06 DIAGNOSIS — O321XX0 Maternal care for breech presentation, not applicable or unspecified: Secondary | ICD-10-CM | POA: Diagnosis not present

## 2022-01-09 DIAGNOSIS — O133 Gestational [pregnancy-induced] hypertension without significant proteinuria, third trimester: Secondary | ICD-10-CM | POA: Diagnosis not present

## 2022-01-16 ENCOUNTER — Other Ambulatory Visit: Payer: Self-pay

## 2022-01-16 DIAGNOSIS — O0992 Supervision of high risk pregnancy, unspecified, second trimester: Secondary | ICD-10-CM | POA: Diagnosis not present

## 2022-01-16 DIAGNOSIS — O133 Gestational [pregnancy-induced] hypertension without significant proteinuria, third trimester: Secondary | ICD-10-CM

## 2022-01-16 DIAGNOSIS — O99212 Obesity complicating pregnancy, second trimester: Secondary | ICD-10-CM

## 2022-01-16 DIAGNOSIS — Z23 Encounter for immunization: Secondary | ICD-10-CM | POA: Diagnosis not present

## 2022-01-16 DIAGNOSIS — Q893 Situs inversus: Secondary | ICD-10-CM

## 2022-01-16 NOTE — Progress Notes (Signed)
G1P0000 at [redacted]w[redacted]d Patient's last menstrual period was 05/23/2021 (approximate)., c/w early UKoreaat [redacted]w[redacted]d Scheduled for induction of labor for gestational hypertension on 02/09/2022 at 0001.   [facility] Pregnancy complicated by: 1. Gestational hypertension, third trimester   2. Obesity affecting pregnancy in second trimester, unspecified obesity type   3. Situs inversus totalis      Prenatal Labs: Blood type/Rh A  Antibody screen neg  Rubella 1.22 (07/21 0826)   Varicella Immune  RPR NR  HBsAg Negative (07/21 0826)   Hep C NR  HIV Non Reactive (07/21 0826)   GC neg  Chlamydia neg  Genetic screening cfDNA negative  1 hour GTT 93  3 hour GTT N/A  GBS Pending    Tdap: given 12/05/2021 Flu: given at work  RSV: given 01/16/2022 Contraception:  TBD Feeding preference:  TBD  ____ AnDrinda ButtsCNM Certified Nurse Midwife [facility]

## 2022-01-23 ENCOUNTER — Encounter: Payer: Self-pay | Admitting: Obstetrics and Gynecology

## 2022-01-23 ENCOUNTER — Other Ambulatory Visit: Payer: Self-pay | Admitting: Obstetrics and Gynecology

## 2022-01-23 ENCOUNTER — Other Ambulatory Visit: Payer: Self-pay

## 2022-01-23 ENCOUNTER — Observation Stay
Admission: EM | Admit: 2022-01-23 | Discharge: 2022-01-23 | Disposition: A | Discharge status: Home / Self Care | Payer: Commercial Managed Care - PPO | Attending: Obstetrics and Gynecology | Admitting: Obstetrics and Gynecology

## 2022-01-23 DIAGNOSIS — Z91040 Latex allergy status: Secondary | ICD-10-CM | POA: Insufficient documentation

## 2022-01-23 DIAGNOSIS — O368330 Maternal care for abnormalities of the fetal heart rate or rhythm, third trimester, not applicable or unspecified: Principal | ICD-10-CM | POA: Insufficient documentation

## 2022-01-23 DIAGNOSIS — Z79899 Other long term (current) drug therapy: Secondary | ICD-10-CM | POA: Insufficient documentation

## 2022-01-23 DIAGNOSIS — O133 Gestational [pregnancy-induced] hypertension without significant proteinuria, third trimester: Secondary | ICD-10-CM | POA: Insufficient documentation

## 2022-01-23 DIAGNOSIS — Z3A35 35 weeks gestation of pregnancy: Secondary | ICD-10-CM | POA: Insufficient documentation

## 2022-01-23 DIAGNOSIS — Z7982 Long term (current) use of aspirin: Secondary | ICD-10-CM | POA: Diagnosis not present

## 2022-01-23 DIAGNOSIS — O0993 Supervision of high risk pregnancy, unspecified, third trimester: Secondary | ICD-10-CM | POA: Insufficient documentation

## 2022-01-23 DIAGNOSIS — O139 Gestational [pregnancy-induced] hypertension without significant proteinuria, unspecified trimester: Secondary | ICD-10-CM | POA: Diagnosis present

## 2022-01-23 LAB — COMPREHENSIVE METABOLIC PANEL
ALT: 14 U/L (ref 0–44)
AST: 17 U/L (ref 15–41)
Albumin: 2.8 g/dL — ABNORMAL LOW (ref 3.5–5.0)
Alkaline Phosphatase: 79 U/L (ref 38–126)
Anion gap: 11 (ref 5–15)
BUN: 8 mg/dL (ref 6–20)
CO2: 20 mmol/L — ABNORMAL LOW (ref 22–32)
Calcium: 9.5 mg/dL (ref 8.9–10.3)
Chloride: 104 mmol/L (ref 98–111)
Creatinine, Ser: 0.65 mg/dL (ref 0.44–1.00)
GFR, Estimated: >60 mL/min (ref >60)
Glucose, Bld: 82 mg/dL (ref 70–99)
Potassium: 3.5 mmol/L (ref 3.5–5.1)
Sodium: 135 mmol/L (ref 135–145)
Total Bilirubin: 0.3 mg/dL (ref 0.3–1.2)
Total Protein: 7 g/dL (ref 6.5–8.1)

## 2022-01-23 LAB — URINALYSIS, COMPLETE (UACMP) WITH MICROSCOPIC
Bilirubin Urine: NEGATIVE
Glucose, UA: NEGATIVE mg/dL
Hgb urine dipstick: NEGATIVE
Ketones, ur: NEGATIVE mg/dL
Nitrite: NEGATIVE
Protein, ur: 30 mg/dL — AB
Specific Gravity, Urine: 1.02 (ref 1.005–1.030)
pH: 6 (ref 5.0–8.0)

## 2022-01-23 LAB — CBC WITH DIFFERENTIAL/PLATELET
Abs Immature Granulocytes: 0.04 10*3/uL (ref 0.00–0.07)
Basophils Absolute: 0.1 10*3/uL (ref 0.0–0.1)
Basophils Relative: 1 %
Eosinophils Absolute: 0.1 10*3/uL (ref 0.0–0.5)
Eosinophils Relative: 1 %
HCT: 40.5 % (ref 36.0–46.0)
Hemoglobin: 13.9 g/dL (ref 12.0–15.0)
Immature Granulocytes: 0 %
Lymphocytes Relative: 20 %
Lymphs Abs: 2.1 10*3/uL (ref 0.7–4.0)
MCH: 29 pg (ref 26.0–34.0)
MCHC: 34.3 g/dL (ref 30.0–36.0)
MCV: 84.6 fL (ref 80.0–100.0)
Monocytes Absolute: 0.6 10*3/uL (ref 0.1–1.0)
Monocytes Relative: 5 %
Neutro Abs: 7.5 10*3/uL (ref 1.7–7.7)
Neutrophils Relative %: 73 %
Platelets: 187 10*3/uL (ref 150–400)
RBC: 4.79 MIL/uL (ref 3.87–5.11)
RDW: 12.8 % (ref 11.5–15.5)
WBC: 10.4 10*3/uL (ref 4.0–10.5)
nRBC: 0 % (ref 0.0–0.2)

## 2022-01-23 LAB — RAPID HIV SCREEN (HIV 1/2 AB+AG)
HIV 1/2 Antibodies: NONREACTIVE
HIV-1 P24 Antigen - HIV24: NONREACTIVE

## 2022-01-23 LAB — PROTEIN / CREATININE RATIO, URINE
Creatinine, Urine: 221 mg/dL
Protein Creatinine Ratio: 0.17 mg/mg{Cre} — ABNORMAL HIGH (ref 0.00–0.15)
Total Protein, Urine: 37 mg/dL

## 2022-01-23 LAB — OB RESULTS CONSOLE GBS: GBS: NEGATIVE

## 2022-01-23 NOTE — OB Triage Note (Signed)
Discharge instructions, labor precautions, and follow-up care reviewed with patient. All questions answered. Patient verbalized understanding. Discharged ambulatory off unit.   

## 2022-01-23 NOTE — Discharge Summary (Signed)
Katherine Rivera is a 27 y.o. female. She is at 27w0dgestation. Patient's last menstrual period was 05/23/2021 (approximate). Estimated Date of Delivery: 02/27/22  [facility] Current pregnancy complicated by:  1. Gestational hypertension, third trimester   2. Obesity affecting pregnancy in second trimester, unspecified obesity type   3. Situs inversus totalis     Chief complaint: She was sent over from the office to be evaluated for fetal tachycardia in the office and [facility]. In the office, fetal baseline on NST was 165bpm. Her BP in the office was about 140/90 so she needs [facility].  S: Resting comfortably. no CTX, no VB.no LOF,  Active fetal movement.  Denies: HA, visual changes, SOB, or RUQ/epigastric pain  Maternal Medical History:   Past Medical History:  Diagnosis Date   Allergy    Elevated ferritin 03/15/2020   Elevated hemoglobin (HCC) 03/15/2020   Situs inversus    Vaccine for human papilloma virus (HPV) types 6, 11, 16, and 18 administered     Past Surgical History:  Procedure Laterality Date   TONSILLECTOMY AND ADENOIDECTOMY     WISDOM TOOTH EXTRACTION      Allergies  Allergen Reactions   Latex Rash    Prior to Admission medications   Medication Sig Start Date End Date Taking? Authorizing Provider  aspirin EC 81 MG tablet Take 81 mg by mouth daily. Swallow whole.    [provider]  Cholecalciferol (VITAMIN D) 125 MCG (5000 UT) CAPS  07/07/21   [provider]  folic acid (FOLVITE) 1 MG tablet Take 1 tablet (1 mg total) by mouth once daily 09/12/21     ondansetron (ZOFRAN) 4 MG tablet Take 1 tablet (4 mg total) by mouth every 12 (twelve) hours as needed for Nausea 09/18/21     Prenatal MV-Min-Fe Fum-FA-DHA (PRENATAL 1 PO)  07/07/21   [provider]      Social History: She  reports that she has never smoked. She has never used smokeless tobacco. She reports that she does not currently use alcohol.  She reports that she does not use drugs.  Family History: family history includes Anxiety disorder in her mother; Asthma in her mother; Cervical cancer in her maternal aunt; Congenital heart disease in her brother; Congestive Heart Failure in her paternal grandfather; Depression in her mother; Drug abuse in her maternal grandmother; Glaucoma in her mother; Heart murmur in her father; Hypertension in her father and paternal grandmother; Lupus in her paternal aunt; Skin cancer in her paternal grandfather; Sleep apnea in her father and paternal grandfather.  no history of gyn cancers  Review of Systems: A full review of systems was performed and negative except as noted in the HPI.     O:  BP 121/63   Pulse 92   Temp 98.3 F (36.8 C) (Oral)   Resp 20   Ht '5\' 7"'$  (1.702 m)   Wt (!) 138.1 kg   LMP 05/23/2021 (Approximate) Comment: appointment with [facility] 7/14  SpO2 99%   BMI 47.68 kg/m  Results for orders placed or performed during the hospital encounter of 01/23/22 (from the past 48 hour(s))  Protein / creatinine ratio, urine   Collection Time: 01/23/22 11:22 AM  Result Value Ref Range   Creatinine, Urine 221 mg/dL   Total Protein, Urine 37 mg/dL   Protein Creatinine Ratio 0.17 (H) 0.00 - 0.15 mg/mg[Cre]  Urinalysis, Complete w Microscopic   Collection Time: 01/23/22 11:22 AM  Result  Value Ref Range   Color, Urine YELLOW (A) YELLOW   APPearance CLOUDY (A) CLEAR   Specific Gravity, Urine 1.020 1.005 - 1.030   pH 6.0 5.0 - 8.0   Glucose, UA NEGATIVE NEGATIVE mg/dL   Hgb urine dipstick NEGATIVE NEGATIVE   Bilirubin Urine NEGATIVE NEGATIVE   Ketones, ur NEGATIVE NEGATIVE mg/dL   Protein, ur 30 (A) NEGATIVE mg/dL   Nitrite NEGATIVE NEGATIVE   Leukocytes,Ua SMALL (A) NEGATIVE   RBC / HPF 0-5 0 - 5 RBC/hpf   WBC, UA 11-20 0 - 5 WBC/hpf   Bacteria, UA MANY (A) NONE SEEN   Squamous Epithelial / HPF 6-10 0 - 5 /HPF   Mucus PRESENT   Comprehensive metabolic panel   Collection Time:  01/23/22 11:36 AM  Result Value Ref Range   Sodium 135 135 - 145 mmol/L   Potassium 3.5 3.5 - 5.1 mmol/L   Chloride 104 98 - 111 mmol/L   CO2 20 (L) 22 - 32 mmol/L   Glucose, Bld 82 70 - 99 mg/dL   BUN 8 6 - 20 mg/dL   Creatinine, Ser 0.65 0.44 - 1.00 mg/dL   Calcium 9.5 8.9 - 10.3 mg/dL   Total Protein 7.0 6.5 - 8.1 g/dL   Albumin 2.8 (L) 3.5 - 5.0 g/dL   AST 17 15 - 41 U/L   ALT 14 0 - 44 U/L   Alkaline Phosphatase 79 38 - 126 U/L   Total Bilirubin 0.3 0.3 - 1.2 mg/dL   GFR, Estimated >60 >60 mL/min   Anion gap 11 5 - 15  Rapid HIV screen (HIV 1/2 Ab+Ag)   Collection Time: 01/23/22 11:36 AM  Result Value Ref Range   HIV-1 P24 Antigen - HIV24 NON REACTIVE NON REACTIVE   HIV 1/2 Antibodies NON REACTIVE NON REACTIVE   Interpretation (HIV Ag Ab)      A non reactive test result means that HIV 1 or HIV 2 antibodies and HIV 1 p24 antigen were not detected in the specimen.  CBC with Differential/Platelet   Collection Time: 01/23/22 11:36 AM  Result Value Ref Range   WBC 10.4 4.0 - 10.5 K/uL   RBC 4.79 3.87 - 5.11 MIL/uL   Hemoglobin 13.9 12.0 - 15.0 g/dL   HCT 40.5 36.0 - 46.0 %   MCV 84.6 80.0 - 100.0 fL   MCH 29.0 26.0 - 34.0 pg   MCHC 34.3 30.0 - 36.0 g/dL   RDW 12.8 11.5 - 15.5 %   Platelets 187 150 - 400 K/uL   nRBC 0.0 0.0 - 0.2 %   Neutrophils Relative % 73 %   Neutro Abs 7.5 1.7 - 7.7 K/uL   Lymphocytes Relative 20 %   Lymphs Abs 2.1 0.7 - 4.0 K/uL   Monocytes Relative 5 %   Monocytes Absolute 0.6 0.1 - 1.0 K/uL   Eosinophils Relative 1 %   Eosinophils Absolute 0.1 0.0 - 0.5 K/uL   Basophils Relative 1 %   Basophils Absolute 0.1 0.0 - 0.1 K/uL   Immature Granulocytes 0 %   Abs Immature Granulocytes 0.04 0.00 - 0.07 K/uL     Vitals:   01/23/22 1102 01/23/22 1117 01/23/22 1135 01/23/22 1150  BP: (!) 130/93 138/87 126/88 125/79   01/23/22 1205 01/23/22 1220 01/23/22 1235  BP: 120/71 120/64 121/63    Constitutional: NAD, AAOx3  HE/ENT: extraocular movements  grossly intact, moist mucous membranes CV: RRR PULM: nl respiratory effort, CTABL     Abd: gravid, non-tender, non-distended,  soft      Ext: Non-tender, Nonedematous   Psych: mood appropriate, speech normal Pelvic: deferred  Fetal  monitoring: Cat 1 Appropriate for GA Baseline: 140bpm Variability: moderate Accelerations: present x >2 Decelerations absent  A/P: 27 y.o. 63w0dhere for antenatal surveillance for PIH work-up and fetal tachycardia  Principle Diagnosis:  High risk pregnancy in third trimester  Labor: not present.  Fetal Wellbeing: Reassuring Cat 1 tracing. Reactive NST  Pre-eclampsia: not present, PIH labs normal, BP improved D/c home stable, precautions reviewed, follow-up as scheduled.    DGertie Fey CNM 01/23/2022 2:40 PM

## 2022-01-23 NOTE — OB Triage Note (Signed)
Patient is a G1P0 at 87w0dwho presents to unit from office for PCoppelleval and NST. Reports +FM, denies vaginal bleeding and LOF. Unable to tell if she's had any ctx. +HA, reports taking Tylenol earlier today and it resolved. Denies epigastric pain and blurry vision. External monitors applied and assessing.

## 2022-01-24 LAB — RPR: RPR Ser Ql: NONREACTIVE

## 2022-01-30 DIAGNOSIS — O0993 Supervision of high risk pregnancy, unspecified, third trimester: Secondary | ICD-10-CM | POA: Diagnosis not present

## 2022-01-30 DIAGNOSIS — Z3A36 36 weeks gestation of pregnancy: Secondary | ICD-10-CM | POA: Diagnosis not present

## 2022-01-30 DIAGNOSIS — O133 Gestational [pregnancy-induced] hypertension without significant proteinuria, third trimester: Secondary | ICD-10-CM | POA: Diagnosis not present

## 2022-02-06 DIAGNOSIS — O133 Gestational [pregnancy-induced] hypertension without significant proteinuria, third trimester: Secondary | ICD-10-CM | POA: Diagnosis not present

## 2022-02-08 ENCOUNTER — Other Ambulatory Visit: Payer: Self-pay

## 2022-02-08 ENCOUNTER — Encounter: Payer: Self-pay | Admitting: Obstetrics and Gynecology

## 2022-02-08 ENCOUNTER — Inpatient Hospital Stay
Admission: EM | Admit: 2022-02-08 | Discharge: 2022-02-13 | DRG: 787 | Disposition: A | Discharge status: Home / Self Care | Payer: Commercial Managed Care - PPO | Attending: Obstetrics | Admitting: Obstetrics

## 2022-02-08 DIAGNOSIS — D62 Acute posthemorrhagic anemia: Secondary | ICD-10-CM | POA: Diagnosis not present

## 2022-02-08 DIAGNOSIS — O1414 Severe pre-eclampsia complicating childbirth: Principal | ICD-10-CM | POA: Diagnosis present

## 2022-02-08 DIAGNOSIS — Z91040 Latex allergy status: Secondary | ICD-10-CM

## 2022-02-08 DIAGNOSIS — O99214 Obesity complicating childbirth: Secondary | ICD-10-CM | POA: Diagnosis present

## 2022-02-08 DIAGNOSIS — O0993 Supervision of high risk pregnancy, unspecified, third trimester: Principal | ICD-10-CM

## 2022-02-08 DIAGNOSIS — O133 Gestational [pregnancy-induced] hypertension without significant proteinuria, third trimester: Secondary | ICD-10-CM

## 2022-02-08 DIAGNOSIS — O139 Gestational [pregnancy-induced] hypertension without significant proteinuria, unspecified trimester: Secondary | ICD-10-CM | POA: Diagnosis present

## 2022-02-08 DIAGNOSIS — O9902 Anemia complicating childbirth: Secondary | ICD-10-CM | POA: Diagnosis not present

## 2022-02-08 DIAGNOSIS — O9081 Anemia of the puerperium: Secondary | ICD-10-CM | POA: Diagnosis not present

## 2022-02-08 DIAGNOSIS — Z3A37 37 weeks gestation of pregnancy: Secondary | ICD-10-CM

## 2022-02-08 DIAGNOSIS — I959 Hypotension, unspecified: Secondary | ICD-10-CM | POA: Diagnosis not present

## 2022-02-08 DIAGNOSIS — Z6841 Body Mass Index (BMI) 40.0 and over, adult: Secondary | ICD-10-CM | POA: Diagnosis not present

## 2022-02-08 DIAGNOSIS — Q893 Situs inversus: Secondary | ICD-10-CM | POA: Diagnosis not present

## 2022-02-08 DIAGNOSIS — O99893 Other specified diseases and conditions complicating puerperium: Secondary | ICD-10-CM | POA: Diagnosis not present

## 2022-02-08 DIAGNOSIS — O134 Gestational [pregnancy-induced] hypertension without significant proteinuria, complicating childbirth: Secondary | ICD-10-CM | POA: Diagnosis not present

## 2022-02-08 LAB — COMPREHENSIVE METABOLIC PANEL
ALT: 14 U/L (ref 0–44)
AST: 20 U/L (ref 15–41)
Albumin: 2.8 g/dL — ABNORMAL LOW (ref 3.5–5.0)
Alkaline Phosphatase: 82 U/L (ref 38–126)
Anion gap: 9 (ref 5–15)
BUN: 11 mg/dL (ref 6–20)
CO2: 22 mmol/L (ref 22–32)
Calcium: 8.9 mg/dL (ref 8.9–10.3)
Chloride: 106 mmol/L (ref 98–111)
Creatinine, Ser: 0.63 mg/dL (ref 0.44–1.00)
GFR, Estimated: >60 mL/min (ref >60)
Glucose, Bld: 113 mg/dL — ABNORMAL HIGH (ref 70–99)
Potassium: 3.6 mmol/L (ref 3.5–5.1)
Sodium: 137 mmol/L (ref 135–145)
Total Bilirubin: 0.4 mg/dL (ref 0.3–1.2)
Total Protein: 7 g/dL (ref 6.5–8.1)

## 2022-02-08 LAB — CBC
HCT: 37.7 % (ref 36.0–46.0)
Hemoglobin: 13.2 g/dL (ref 12.0–15.0)
MCH: 29.9 pg (ref 26.0–34.0)
MCHC: 35 g/dL (ref 30.0–36.0)
MCV: 85.3 fL (ref 80.0–100.0)
Platelets: 193 10*3/uL (ref 150–400)
RBC: 4.42 MIL/uL (ref 3.87–5.11)
RDW: 13.1 % (ref 11.5–15.5)
WBC: 11.1 10*3/uL — ABNORMAL HIGH (ref 4.0–10.5)
nRBC: 0 % (ref 0.0–0.2)

## 2022-02-08 LAB — PROTEIN / CREATININE RATIO, URINE
Creatinine, Urine: 201 mg/dL
Protein Creatinine Ratio: 0.17 mg/mg{Cre} — ABNORMAL HIGH (ref 0.00–0.15)
Total Protein, Urine: 34 mg/dL

## 2022-02-08 LAB — TYPE AND SCREEN
ABO/RH(D): A POS
Antibody Screen: NEGATIVE

## 2022-02-08 MED ORDER — FENTANYL CITRATE (PF) 100 MCG/2ML IJ SOLN
50.0000 ug | INTRAMUSCULAR | Status: DC | PRN
  Administered 2022-02-09: 50 ug via INTRAVENOUS
  Filled 2022-02-08: qty 2

## 2022-02-08 MED ORDER — LABETALOL HCL 5 MG/ML IV SOLN
80.0000 mg | INTRAVENOUS | Status: DC | PRN
  Filled 2022-02-08: qty 16

## 2022-02-08 MED ORDER — MISOPROSTOL 25 MCG QUARTER TABLET
ORAL_TABLET | ORAL | Status: AC
  Administered 2022-02-08: 25 ug via VAGINAL
  Filled 2022-02-08: qty 2

## 2022-02-08 MED ORDER — ACETAMINOPHEN 500 MG PO TABS
1000.0000 mg | ORAL_TABLET | Freq: Four times a day (QID) | ORAL | Status: DC | PRN
  Administered 2022-02-09 (×3): 1000 mg via ORAL
  Filled 2022-02-08 (×3): qty 2

## 2022-02-08 MED ORDER — MISOPROSTOL 25 MCG QUARTER TABLET
25.0000 ug | ORAL_TABLET | ORAL | Status: DC | PRN
  Administered 2022-02-08: 25 ug via ORAL

## 2022-02-08 MED ORDER — LABETALOL HCL 5 MG/ML IV SOLN
20.0000 mg | INTRAVENOUS | Status: DC | PRN
  Administered 2022-02-09: 20 mg via INTRAVENOUS
  Filled 2022-02-08: qty 4

## 2022-02-08 MED ORDER — OXYTOCIN BOLUS FROM INFUSION: 333.0000 mL | Freq: Once | INTRAVENOUS | Status: DC

## 2022-02-08 MED ORDER — LACTATED RINGERS IV SOLN: INTRAVENOUS | Status: DC

## 2022-02-08 MED ORDER — LABETALOL HCL 5 MG/ML IV SOLN
40.0000 mg | INTRAVENOUS | Status: DC | PRN
  Administered 2022-02-09: 40 mg via INTRAVENOUS
  Filled 2022-02-08: qty 8

## 2022-02-08 MED ORDER — AMMONIA AROMATIC IN INHA
RESPIRATORY_TRACT | Status: AC
  Filled 2022-02-08: qty 10

## 2022-02-08 MED ORDER — MISOPROSTOL 25 MCG QUARTER TABLET: 25.0000 ug | ORAL_TABLET | ORAL | Status: DC

## 2022-02-08 MED ORDER — OXYTOCIN 10 UNIT/ML IJ SOLN
INTRAMUSCULAR | Status: AC
  Filled 2022-02-08: qty 2

## 2022-02-08 MED ORDER — OXYTOCIN-SODIUM CHLORIDE 30-0.9 UT/500ML-% IV SOLN
2.5000 [IU]/h | INTRAVENOUS | Status: DC
  Administered 2022-02-10: 2.5 [IU]/h via INTRAVENOUS

## 2022-02-08 MED ORDER — SODIUM CHLORIDE 0.9% FLUSH: 3.0000 mL | INTRAVENOUS | Status: DC | PRN

## 2022-02-08 MED ORDER — LIDOCAINE HCL (PF) 1 % IJ SOLN
30.0000 mL | INTRAMUSCULAR | Status: DC | PRN
  Filled 2022-02-08: qty 30

## 2022-02-08 MED ORDER — SODIUM CHLORIDE 0.9 % IV SOLN: 250.0000 mL | INTRAVENOUS | Status: DC | PRN

## 2022-02-08 MED ORDER — ONDANSETRON HCL 4 MG/2ML IJ SOLN
4.0000 mg | Freq: Four times a day (QID) | INTRAMUSCULAR | Status: DC | PRN
  Administered 2022-02-09: 4 mg via INTRAVENOUS
  Filled 2022-02-08: qty 2

## 2022-02-08 MED ORDER — OXYTOCIN-SODIUM CHLORIDE 30-0.9 UT/500ML-% IV SOLN
1.0000 m[IU]/min | INTRAVENOUS | Status: DC
  Administered 2022-02-09 (×2): 2 m[IU]/min via INTRAVENOUS
  Filled 2022-02-08: qty 500

## 2022-02-08 MED ORDER — SODIUM CHLORIDE 0.9% FLUSH: 3.0000 mL | Freq: Two times a day (BID) | INTRAVENOUS | Status: DC

## 2022-02-08 MED ORDER — MISOPROSTOL 200 MCG PO TABS
ORAL_TABLET | ORAL | Status: AC
  Filled 2022-02-08: qty 4

## 2022-02-08 MED ORDER — HYDRALAZINE HCL 20 MG/ML IJ SOLN: 10.0000 mg | INTRAMUSCULAR | Status: DC | PRN

## 2022-02-08 MED ORDER — SOD CITRATE-CITRIC ACID 500-334 MG/5ML PO SOLN: 30.0000 mL | ORAL | Status: DC | PRN

## 2022-02-08 MED ORDER — LACTATED RINGERS IV SOLN
500.0000 mL | INTRAVENOUS | Status: DC | PRN
  Administered 2022-02-08: 500 mL via INTRAVENOUS

## 2022-02-08 MED ORDER — TERBUTALINE SULFATE 1 MG/ML IJ SOLN
0.2500 mg | Freq: Once | INTRAMUSCULAR | Status: DC | PRN
  Filled 2022-02-08: qty 1

## 2022-02-08 NOTE — H&P (Signed)
OB History & Physical   History of Present Illness:   Chief Complaint: IOL for GHTN  HPI:  Katherine Rivera is a 27 y.o. G1P0000 female at [redacted]w[redacted]d Patient's last menstrual period was 05/23/2021 (approximate)., consistent with UKoreaat 19w5dwith Estimated Date of Delivery: 02/27/22.  She presents to L&D for IOL for GHTN  Reports active fetal movement  Contractions: denies  LOF/SROM: intact Vaginal bleeding: none  Factors complicating pregnancy:  GHTN third trimester Obesity Situs inversus totalis  Patient Active Problem List   Diagnosis Date Noted   Gestational hypertension 01/23/2022   Breech presentation, not applicable or unspecified fetus 01/06/2022   Family history of congenital heart defect 09/11/2021   Obesity affecting pregnancy in second trimester 09/11/2021   Supervision of high-risk pregnancy 09/09/2021   Supervision of high risk pregnancy in second trimester 09/09/2021   Vitamin D deficiency 03/15/2020   Posterior cervical lymphadenopathy- right  02/13/2020   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    Prenatal Transfer Tool  Maternal Diabetes: No Genetic Screening: Normal Maternal Ultrasounds/Referrals: Normal Fetal Ultrasounds or other Referrals:  None Maternal Substance Abuse:  No Significant Maternal Medications:  None Significant Maternal Lab Results: Group B Strep negative  Maternal Medical History:   Past Medical History:  Diagnosis Date   Allergy    Elevated ferritin 03/15/2020   Elevated hemoglobin (HCC) 03/15/2020   Situs inversus    Vaccine for human papilloma virus (HPV) types 6, 11, 16, and 18 administered     Past Surgical History:  Procedure Laterality Date   TONSILLECTOMY AND ADENOIDECTOMY     WISDOM TOOTH EXTRACTION      Allergies  Allergen Reactions   Latex Rash    Prior to Admission medications   Medication Sig Start Date End Date Taking? Authorizing Provider  aspirin EC 81 MG tablet Take 81 mg by mouth daily. Swallow whole.   Yes  [provider]  Cholecalciferol (VITAMIN D) 125 MCG (5000 UT) CAPS  07/07/21  Yes [provider]  famotidine (PEPCID) 20 MG tablet Take 20 mg by mouth 2 (two) times daily.   Yes [provider]  folic acid (FOLVITE) 1 MG tablet Take 1 tablet (1 mg total) by mouth once daily 09/12/21  Yes   Prenatal MV-Min-Fe Fum-FA-DHA (PRENATAL 1 PO)  07/07/21  Yes [provider]  ondansetron (ZOFRAN) 4 MG tablet Take 1 tablet (4 mg total) by mouth every 12 (twelve) hours as needed for Nausea Patient not taking: Reported on 02/08/2022 09/18/21        [facility]  OB History  Gravida Para Term Preterm AB Living  1 0 0 0 0 0  SAB IAB Ectopic Multiple Live Births  0 0 0 0 0    # Outcome Date GA Lbr Len/2nd Weight Sex Delivery Anes PTL Lv  1 Current              Social History: She  reports that she has never smoked. She has never used smokeless tobacco. She reports that she does not currently use alcohol. She reports that she does not use drugs.  Family History: family history includes Anxiety disorder in her mother; Asthma in her mother; Cervical cancer in her maternal aunt; Congenital heart disease in her brother; Congestive Heart Failure in her paternal grandfather; Depression in her mother; Drug abuse in her maternal grandmother; Glaucoma in her mother; Heart murmur in her father; Hypertension in her father and paternal grandmother; Lupus in her  paternal aunt; Skin cancer in her paternal grandfather; Sleep apnea in her father and paternal grandfather.   Review of Systems: A full review of systems was performed and negative except as noted in the HPI.     Physical Exam:  Vital Signs: LMP 05/23/2021 (Approximate) Comment: appointment with [facility] 7/14  General: no acute distress.  HEENT: normocephalic, atraumatic Heart: regular rate & rhythm Lungs: normal respiratory effort Abdomen: soft, gravid, non-tender;  EFW: 6.10  lbd Pelvic:   External: Normal external female genitalia  Cervix:   /   /      Extremities: non-tender, symmetric, no edema bilaterally.  DTRs: +2  Neurologic: Alert & oriented x 3.    No results found for this or any previous visit (from the past 24 hour(s)).  Pertinent Results:  Prenatal Labs: Blood type/Rh A   Antibody screen Negative    Rubella 1.22 (07/21 0826)   Varicella Immune  RPR NON REACTIVE (01/05 1136)   HBsAg Negative (07/21 0826)  Hep C NR   HIV NON REACTIVE (01/05 1136)   GC neg  Chlamydia neg  Genetic screening cfDNA negative   1 hour GTT 93  3 hour GTT   GBS neg     FHT:  FHR: 145 bpm, variability: moderate,  accelerations:  Present,  decelerations:  Absent Category/reactivity:  Category I UC:   none   Cephalic by Leopolds and SVE   No results found.  Assessment:  Katherine Rivera is a 27 y.o. G1P0000 female at 61w2dwith GHTN, obesity.   Plan:  1. Admit to Labor & Delivery - consents reviewed and obtained - Dr. JGlennon Macnotified of admission and plan of care   2. Fetal Well being  - Fetal Tracing: category 1 - Group B Streptococcus ppx not indicated: GBS negative - Presentation: cephalic confirmed by SVE   3. Routine OB: - Prenatal labs reviewed, as above - Rh positive - CBC, T&S, RPR on admit - Clear liquid diet , continuous IV fluids  4. Induction of labor  - Contractions monitored with external toco - Pelvis adequate for trial of labor  - Plan for induction with misoprostol and oxytocin  - Augmentation with oxytocin and AROM as appropriate  - Plan for  continuous fetal monitoring - Maternal pain control as desired; planning regional anesthesia and IVPM - Anticipate vaginal delivery  5. Post Partum Planning: - Infant feeding: breast feeding - Contraception:  undecided - Tdap vaccine: Given prenatally - Flu vaccine: Given prenatally  FBellow[place] Falls CNM 007/37/1086:26PM  FAvelino Leeds CSomersetCertified Nurse  Midwife [facility]

## 2022-02-09 ENCOUNTER — Inpatient Hospital Stay: Payer: Commercial Managed Care - PPO | Admitting: Anesthesiology

## 2022-02-09 LAB — COMPREHENSIVE METABOLIC PANEL
ALT: 14 U/L (ref 0–44)
AST: 24 U/L (ref 15–41)
Albumin: 2.8 g/dL — ABNORMAL LOW (ref 3.5–5.0)
Alkaline Phosphatase: 78 U/L (ref 38–126)
Anion gap: 11 (ref 5–15)
BUN: 9 mg/dL (ref 6–20)
CO2: 20 mmol/L — ABNORMAL LOW (ref 22–32)
Calcium: 9 mg/dL (ref 8.9–10.3)
Chloride: 106 mmol/L (ref 98–111)
Creatinine, Ser: 0.53 mg/dL (ref 0.44–1.00)
GFR, Estimated: >60 mL/min (ref >60)
Glucose, Bld: 79 mg/dL (ref 70–99)
Potassium: 3.7 mmol/L (ref 3.5–5.1)
Sodium: 137 mmol/L (ref 135–145)
Total Bilirubin: 0.4 mg/dL (ref 0.3–1.2)
Total Protein: 6.8 g/dL (ref 6.5–8.1)

## 2022-02-09 LAB — CBC
HCT: 38.1 % (ref 36.0–46.0)
Hemoglobin: 13.3 g/dL (ref 12.0–15.0)
MCH: 29.7 pg (ref 26.0–34.0)
MCHC: 34.9 g/dL (ref 30.0–36.0)
MCV: 85 fL (ref 80.0–100.0)
Platelets: 181 10*3/uL (ref 150–400)
RBC: 4.48 MIL/uL (ref 3.87–5.11)
RDW: 13.3 % (ref 11.5–15.5)
WBC: 10.5 10*3/uL (ref 4.0–10.5)
nRBC: 0 % (ref 0.0–0.2)

## 2022-02-09 LAB — RPR: RPR Ser Ql: NONREACTIVE

## 2022-02-09 LAB — ABO/RH: ABO/RH(D): A POS

## 2022-02-09 MED ORDER — CARBOPROST TROMETHAMINE 250 MCG/ML IM SOLN
INTRAMUSCULAR | Status: AC
  Filled 2022-02-09: qty 1

## 2022-02-09 MED ORDER — LABETALOL HCL 5 MG/ML IV SOLN: 40.0000 mg | INTRAVENOUS | Status: DC | PRN

## 2022-02-09 MED ORDER — PHENYLEPHRINE 80 MCG/ML (10ML) SYRINGE FOR IV PUSH (FOR BLOOD PRESSURE SUPPORT): 80.0000 ug | PREFILLED_SYRINGE | INTRAVENOUS | Status: DC | PRN

## 2022-02-09 MED ORDER — HYDRALAZINE HCL 20 MG/ML IJ SOLN: 10.0000 mg | INTRAMUSCULAR | Status: DC | PRN

## 2022-02-09 MED ORDER — FENTANYL-BUPIVACAINE-NACL 0.5-0.125-0.9 MG/250ML-% EP SOLN
12.0000 mL/h | EPIDURAL | Status: DC | PRN
  Administered 2022-02-09: 12 mL/h via EPIDURAL

## 2022-02-09 MED ORDER — CALCIUM GLUCONATE 10 % IV SOLN
INTRAVENOUS | Status: AC
  Filled 2022-02-09: qty 10

## 2022-02-09 MED ORDER — FENTANYL-BUPIVACAINE-NACL 0.5-0.125-0.9 MG/250ML-% EP SOLN
EPIDURAL | Status: AC
  Filled 2022-02-09: qty 250

## 2022-02-09 MED ORDER — TRANEXAMIC ACID-NACL 1000-0.7 MG/100ML-% IV SOLN
INTRAVENOUS | Status: AC
  Filled 2022-02-09: qty 100

## 2022-02-09 MED ORDER — CALCIUM CARBONATE ANTACID 500 MG PO CHEW
CHEWABLE_TABLET | ORAL | Status: AC
  Filled 2022-02-09: qty 2

## 2022-02-09 MED ORDER — DIPHENHYDRAMINE HCL 50 MG/ML IJ SOLN: 12.5000 mg | INTRAMUSCULAR | Status: DC | PRN

## 2022-02-09 MED ORDER — LABETALOL HCL 5 MG/ML IV SOLN: 80.0000 mg | INTRAVENOUS | Status: DC | PRN

## 2022-02-09 MED ORDER — CALCIUM CARBONATE ANTACID 500 MG PO CHEW
2.0000 | CHEWABLE_TABLET | Freq: Once | ORAL | Status: AC
  Administered 2022-02-09: 400 mg via ORAL
  Filled 2022-02-09: qty 2

## 2022-02-09 MED ORDER — LACTATED RINGERS IV SOLN: INTRAVENOUS | Status: DC

## 2022-02-09 MED ORDER — LABETALOL HCL 5 MG/ML IV SOLN: 20.0000 mg | INTRAVENOUS | Status: DC | PRN

## 2022-02-09 MED ORDER — CALCIUM CARBONATE ANTACID 500 MG PO CHEW
2.0000 | CHEWABLE_TABLET | Freq: Four times a day (QID) | ORAL | Status: DC | PRN
  Administered 2022-02-09: 400 mg via ORAL

## 2022-02-09 MED ORDER — EPHEDRINE 5 MG/ML INJ: 10.0000 mg | INTRAVENOUS | Status: DC | PRN

## 2022-02-09 MED ORDER — LACTATED RINGERS IV SOLN
500.0000 mL | Freq: Once | INTRAVENOUS | Status: AC
  Administered 2022-02-09: 250 mL via INTRAVENOUS

## 2022-02-09 MED ORDER — LIDOCAINE HCL (PF) 1 % IJ SOLN
INTRAMUSCULAR | Status: DC | PRN
  Administered 2022-02-09: 3 mL

## 2022-02-09 MED ORDER — LIDOCAINE-EPINEPHRINE (PF) 1.5 %-1:200000 IJ SOLN
INTRAMUSCULAR | Status: DC | PRN
  Administered 2022-02-09: 3 mL via EPIDURAL

## 2022-02-09 MED ORDER — MAGNESIUM SULFATE BOLUS VIA INFUSION
4.0000 g | Freq: Once | INTRAVENOUS | Status: AC
  Administered 2022-02-09: 4 g via INTRAVENOUS
  Filled 2022-02-09: qty 1000

## 2022-02-09 MED ORDER — MAGNESIUM SULFATE 40 GM/1000ML IV SOLN
2.0000 g/h | INTRAVENOUS | Status: DC
  Administered 2022-02-09 – 2022-02-10 (×2): 2 g/h via INTRAVENOUS
  Filled 2022-02-09 (×2): qty 1000

## 2022-02-09 NOTE — Progress Notes (Signed)
Labor Progress Note  Katherine Rivera is a 27 y.o. G1P0000 at 60w3dby LMP [facility]  Subjective: in pain, not coping well. Crying. Still has HA.   Objective: BP (!) 145/99   Pulse (!) 106   Temp 97.7 F (36.5 C) (Oral)   Resp 18   Ht '5\' 7"'$  (1.702 m)   Wt (!) 141.4 kg   LMP 05/23/2021 (Approximate) Comment: appointment with [facility] 7/14  SpO2 97%   BMI 48.82 kg/m  Notable VS details: reviewed Vitals:   02/09/22 0605 02/09/22 0607 02/09/22 0625 02/09/22 0636  BP: (!) 145/121 (!) 141/93 (!) 155/83 (!) 141/74   02/09/22 0646 02/09/22 0722 02/09/22 0747 02/09/22 0804  BP: (!) 144/102 (!) 130/91 (!) 182/109 (!) 155/94   02/09/22 0917 02/09/22 0926 02/09/22 0929 02/09/22 0957  BP: (!) 196/125 (!) 238/141 (!) 153/91 (!) 145/99   Lungs CTABL DTR2-3+, no clonus  Fetal Assessment: FHT:  FHR: 150 bpm, variability: moderate,  accelerations:  Present,  decelerations:  Absent Category/reactivity:  Category I UC:   q2 min, palpate mod  SVE:   3-4/80/-1, soft, midposition - scant pink bloody show noted.  - IUPC placed, baseline 425mg; pitocin DC - FSE placed, having trouble continuous external monitoring.   Membrane status: SROM at 0708 Amniotic color: clear  Labs: Lab Results  Component Value Date   WBC 10.5 02/09/2022   HGB 13.3 02/09/2022   HCT 38.1 02/09/2022   MCV 85.0 02/09/2022   PLT 181 02/09/2022    Assessment / Plan: G1P0000 at 3743w3dOL for GHTN; now Pre-E with severe features.   Labor: s.p Cytotec with intermittent cat II tracing, now on Pitocin and SROM at 0708 this morning.internal monitors now, Pitocin DC due to elevated resting tone, cervical change as noted.  Preeclampsia: P/C ratio on admission 0.37, with elevated BP- dx Pre-E  severe range BP and HA-  Mag sulfate at 2gm/hr due to severe features.  Repeat labs stable. Continues to have intermittent severe range BP with mild range repeat.  Fetal Wellbeing:  Category  I Pain Control:  Labor support without medications; anesthesia requested to bedside for epidural.  I/D:   GBS neg   RebFrancetta FoundNM 02/09/2022, 11:32 AM

## 2022-02-09 NOTE — Progress Notes (Signed)
Labor Progress Note  Katherine Rivera is a 27 y.o. G1P0000 at 77w3dby LMP [facility]  Subjective: now comfortable with epidural.   Objective: BP (!) 150/84   Pulse 98   Temp 97.7 F (36.5 C) (Axillary)   Resp 18   Ht '5\' 7"'$  (1.702 m)   Wt (!) 141.4 kg   LMP 05/23/2021 (Approximate) Comment: appointment with [facility] 7/14  SpO2 97%   BMI 48.82 kg/m  Notable VS details: reviewed Vitals:   02/09/22 0929 02/09/22 0957 02/09/22 1115 02/09/22 1135  BP: (!) 153/91 (!) 145/99 (!) 174/101 (!) 169/106   02/09/22 1140 02/09/22 1143 02/09/22 1146 02/09/22 1149  BP: (!) 166/93 (!) 154/82 (!) 163/87 (!) 167/90   02/09/22 1154 02/09/22 1159 02/09/22 1204 02/09/22 1219  BP: (!) 158/79 (!) 154/77 (!) 154/81 (!) 150/84   Lungs CTABL DTR2+, no clonus  Fetal Assessment: FHT:  FHR: 145 bpm, variability: moderate,  accelerations:  Present,  decelerations:  Present variables- intermittent Category/reactivity:  Category II UC:   q2-4 min, baseline 30-356mg after flushing and re-zero. MVUs inadequate, unable to calculate. IUPC pulled back a few Cm, will re-zero.   SVE:   4/80/-1, soft, midposition - normal bloody show noted.    Membrane status: SROM at 0708 Amniotic color: clear  Labs: Lab Results  Component Value Date   WBC 10.5 02/09/2022   HGB 13.3 02/09/2022   HCT 38.1 02/09/2022   MCV 85.0 02/09/2022   PLT 181 02/09/2022    Assessment / Plan: G1P0000 at 3765w3dOL for GHTN; now Pre-E with severe features.   Labor: s.p Cytotec with intermittent cat II tracing, now on Pitocin and SROM at 0708 this morning.s/p IUPC and FSE,will restart Pitocin now. Consider replacing IUPC.  Preeclampsia: P/C ratio on admission 0.37, with elevated BP- dx Pre-E  severe range BP and HA-  Mag sulfate at 2gm/hr due to severe features.  Repeat labs stable. Continues to have intermittent severe range BP with mild range repeat.  Fetal Wellbeing:  Category I Pain  Control:  Epidural I/D:   GBS neg   RebMurray HodgkinsVey, CNM 02/09/2022, 1:10 PM

## 2022-02-09 NOTE — Anesthesia Procedure Notes (Signed)
Epidural Patient location during procedure: OB Start time: 02/09/2022 12:13 PM End time: 02/09/2022 12:13 PM  Staffing Anesthesiologist: Iran Ouch, MD Performed: anesthesiologist   Preanesthetic Checklist Completed: patient identified, IV checked, site marked, risks and benefits discussed, surgical consent, monitors and equipment checked, pre-op evaluation and timeout performed  Epidural Patient position: sitting Prep: ChloraPrep Patient monitoring: heart rate, continuous pulse ox and blood pressure Approach: midline Location: L3-L4 Injection technique: LOR saline  Needle:  Needle type: Tuohy  Needle gauge: 18 G Needle length: 15 cm Needle insertion depth: 10 cm Catheter type: closed end Catheter size: 20 Guage Catheter at skin depth: 15 cm Test dose: negative and 1.5% lidocaine with Epi 1:200 K  Assessment Events: blood not aspirated, no cerebrospinal fluid, injection not painful, no injection resistance and no paresthesia  Additional Notes Reason for block:procedure for pain

## 2022-02-09 NOTE — Progress Notes (Signed)
Labor Progress Note  Katherine Rivera is a 27 y.o. G1P0000 at 40w3dby LMP [facility]  Subjective:  comfortable with epidural.   Objective: BP 136/68   Pulse 98   Temp 97.7 F (36.5 C) (Axillary)   Resp 18   Ht '5\' 7"'$  (1.702 m)   Wt (!) 141.4 kg   LMP 05/23/2021 (Approximate) [facility]  SpO2 97%   BMI 48.82 kg/m  Notable VS details: reviewed Vitals:   02/09/22 1146 02/09/22 1149 02/09/22 1154 02/09/22 1159  BP: (!) 163/87 (!) 167/90 (!) 158/79 (!) 154/77   02/09/22 1204 02/09/22 1219 02/09/22 1234 02/09/22 1249  BP: (!) 154/81 (!) 150/84 (!) 149/77 (!) 149/77   02/09/22 1304 02/09/22 1401 02/09/22 1404 02/09/22 1504  BP: (!) 149/77 (!) 146/55 (!) 138/53 136/68   Lungs CTABL DTR2+, no clonus  Fetal Assessment: FHT:  FHR: 135 bpm, variability: moderate,  accelerations:  Present,  decelerations:  Present variables- intermittent Category/reactivity:  Category II UC:   q2-4 min, tracing poorly with toco  SVE:   4/100/-1, soft,anterior - caput noted, suspect asinclitic - normal bloody show noted.  - IUPC replaced.    Membrane status: SROM at 0708 Amniotic color: clear  Urine Output  Intake/Output Summary (Last 24 hours) at 02/09/2022 1605 Last data filed at 02/09/2022 1542 Gross per 24 hour  Intake 3619.99 ml  Output 1375 ml  Net 2244.99 ml     Labs: Lab Results  Component Value Date   WBC 10.5 02/09/2022   HGB 13.3 02/09/2022   HCT 38.1 02/09/2022   MCV 85.0 02/09/2022   PLT 181 02/09/2022    Assessment / Plan: G1P0000 at 333w3dIOL for GHTN-> Pre-E with severe features.   Labor: s.p Cytotec with intermittent cat II tracing, now on Pitocin and SROM at 0708 this morning.s/p IUPC and FSE,will restart Pitocin now. Replaced IUPC; continue to titrate Pitocin.  Preeclampsia: P/C ratio on admission 0.37, with elevated BP- dx Pre-E  severe range BP and HA-  Mag sulfate at 2gm/hr due to severe  features.  Repeat labs stable. BP improved after epidural.  Fetal Wellbeing:  Category I Pain Control:  Epidural I/D:   GBS neg   ReFrancetta FoundCNM 02/09/2022, 4:04 PM

## 2022-02-09 NOTE — Progress Notes (Signed)
Notified by staff RN of severe range BP, patient treated with conditional labetalol orders.  Avelino Leeds CNM

## 2022-02-09 NOTE — Progress Notes (Signed)
Labor Progress Note  Katherine Rivera is a 27 y.o. G1P0000 at 63w3dby LMP [facility]  Subjective:  comfortable   Objective: BP (!) 139/59   Pulse (!) 103   Temp 98.2 F (36.8 C) (Oral)   Resp 20   Ht '5\' 7"'$  (1.702 m)   Wt (!) 141.4 kg   LMP 05/23/2021 (Approximate) Comment: appointment with [facility] 7/14  SpO2 96%   BMI 48.82 kg/m  Notable VS details: reviewed Vitals:   02/09/22 1234 02/09/22 1249 02/09/22 1304 02/09/22 1401  BP: (!) 149/77 (!) 149/77 (!) 149/77 (!) 146/55   02/09/22 1404 02/09/22 1504 02/09/22 1604 02/09/22 1704  BP: (!) 138/53 136/68 (!) 150/88 (!) 157/91   02/09/22 1805 02/09/22 1905 02/09/22 1914 02/09/22 2005  BP: (!) 148/78 (!) 162/90 (!) 145/75 (!) 139/59   Lungs CTABL DTR2+, no clonus  Fetal Assessment: FHT:  FHR: 135 bpm, variability: moderate,  accelerations:  Present,  decelerations:  Present variables Category/reactivity:  Category II UC:   q2-4 min with poorly tracing Toco, PItocin at 6 mu/min SVE:   9/100/0 soft,anterior - caput vs molding noted - normal bloody show noted.   Membrane status: SROM at 0708 Amniotic color: clear  Urine Output  Intake/Output Summary (Last 24 hours) at 02/09/2022 2110 Last data filed at 02/09/2022 1934 Gross per 24 hour  Intake 4463.15 ml  Output 2700 ml  Net 1763.15 ml      Labs: Lab Results  Component Value Date   WBC 10.5 02/09/2022   HGB 13.3 02/09/2022   HCT 38.1 02/09/2022   MCV 85.0 02/09/2022   PLT 181 02/09/2022    Assessment / Plan: G1P0000 at 333w3dIOL for GHTN-> Pre-E with severe features.   Labor: s.p Cytotec with intermittent cat II tracing, now on Pitocin and SROM at 0708 this morning.s/p IUPC and FSE, with IUPC replaced and again not working well. Fundus palpates soft between UCs. Toco in place but not tracing well.  Making cervical change and Dr BeLeafy Ropdated.  Preeclampsia: P/C ratio on admission 0.37, with elevated BP- dx Pre-E  severe  range BP and HA-  Mag sulfate at 2gm/hr due to severe features.  Repeat labs stable. BP remains normal to mild range.  Fetal Wellbeing:  Category II Pain Control:  Epidural I/D:   GBS neg   Katherine FoundCNM 02/09/2022, 9:10 PM

## 2022-02-09 NOTE — Progress Notes (Signed)
Labor Progress Note  Katherine Rivera is a 27 y.o. G1P0000 at 42w3dby LMP [facility]  Subjective: headache  Objective: BP (!) 155/94   Pulse 95   Temp 97.7 F (36.5 C) (Oral)   Resp 18   Ht '5\' 7"'$  (1.702 m)   Wt (!) 141.4 kg   LMP 05/23/2021 (Approximate) Comment: appointment with [facility] 7/14  BMI 48.82 kg/m  Notable VS details: reviewed Vitals:   02/09/22 0525 02/09/22 0535 02/09/22 0545 02/09/22 0556  BP: (!) 141/77 (!) 144/87 (!) 142/79 (!) 141/73   02/09/22 0605 02/09/22 0607 02/09/22 0625 02/09/22 0636  BP: (!) 145/121 (!) 141/93 (!) 155/83 (!) 141/74   02/09/22 0646 02/09/22 0722 02/09/22 0747 02/09/22 0804  BP: (!) 144/102 (!) 130/91 (!) 182/109 (!) 155/94     Fetal Assessment: FHT:  FHR: 150 bpm, variability: moderate,  accelerations:  Present,  decelerations:  Absent Category/reactivity:  Category I UC:   q2-46m, palpate mild SVE:   deferred for now.  Last exam Dilation: 1 Effacement (%): 50 Cervical Position: Posterior Station: -3 Exam by:: J Gupthill CNM  Membrane status: SROM at 0708 Amniotic color: clear  Labs: Lab Results  Component Value Date   WBC 11.1 (H) 02/08/2022   HGB 13.2 02/08/2022   HCT 37.7 02/08/2022   MCV 85.3 02/08/2022   PLT 193 02/08/2022    Assessment / Plan: G1P0000 at 3742w3dOL for GHTN; now Pre-E with severe features.   Labor: s.p Cytotec with intermittent cat II tracing, now on Pitocin and SROM at 0708 this morning. Plan for internal monitors to facilitate better UC tracing. .  Preeclampsia: P/C ratio on admission 0.37, with elevated BP- dx Pre-E  severe range BP and HA- discussed with Dr beaLeafy Roill start Mag sulfate due to severe features.  Will repeat Labs now.  Fetal Wellbeing:  Category I Pain Control:  Labor support without medications I/D:   GBS neg   RebFrancetta FoundNM 02/09/2022, 8:41 AM

## 2022-02-09 NOTE — Progress Notes (Addendum)
Labor Progress Note  Katherine Rivera is a 27 y.o. G1P0000 at 65w3dby LMP [facility]  Subjective:  feeling discomfort on right side- has been laying on left side.    Objective: BP (!) 150/88   Pulse 97   Temp 97.9 F (36.6 C) (Oral)   Resp 18   Ht '5\' 7"'$  (1.702 m)   Wt (!) 141.4 kg   LMP 05/23/2021 (Approximate) Comment: appointment with [facility] 7/14  SpO2 96%   BMI 48.82 kg/m  Notable VS details: reviewed Vitals:   02/09/22 1149 02/09/22 1154 02/09/22 1159 02/09/22 1204  BP: (!) 167/90 (!) 158/79 (!) 154/77 (!) 154/81   02/09/22 1219 02/09/22 1234 02/09/22 1249 02/09/22 1304  BP: (!) 150/84 (!) 149/77 (!) 149/77 (!) 149/77   02/09/22 1401 02/09/22 1404 02/09/22 1504 02/09/22 1604  BP: (!) 146/55 (!) 138/53 136/68 (!) 150/88   Lungs CTABL DTR2+, no clonus  Fetal Assessment: FHT:  FHR: 135 bpm, variability: moderate,  accelerations:  Present,  decelerations:  Present variables- intermittent Category/reactivity:  Category II UC:   q2-4 min, MVUs 100, resting tone 35-40; PItocin at 10 mu/min SVE:   6/100/-1, soft,anterior - caput noted - normal bloody show noted.  - IUPC flushed and zeroed   Membrane status: SROM at 0708 Amniotic color: clear  Urine Output  Intake/Output Summary (Last 24 hours) at 02/09/2022 1811 Last data filed at 02/09/2022 1630 Gross per 24 hour  Intake 4004.75 ml  Output 1525 ml  Net 2479.75 ml     Labs: Lab Results  Component Value Date   WBC 10.5 02/09/2022   HGB 13.3 02/09/2022   HCT 38.1 02/09/2022   MCV 85.0 02/09/2022   PLT 181 02/09/2022    Assessment / Plan: G1P0000 at 327w3dIOL for GHTN-> Pre-E with severe features.   Labor: s.p Cytotec with intermittent cat II tracing, now on Pitocin and SROM at 0708 this morning.s/p IUPC and FSE,will restart Pitocin now. IUPC rezeroed and flushed; continue to titrate Pitocin. Now active labor at 6cm  Preeclampsia: P/C ratio on admission 0.37, with  elevated BP- dx Pre-E  severe range BP and HA-  Mag sulfate at 2gm/hr due to severe features.  Repeat labs stable. BP improved since epidural.  Fetal Wellbeing:  Category I Pain Control:  Epidural I/D:   GBS neg   ReFrancetta FoundCNM 02/09/2022, 6:11 PM

## 2022-02-09 NOTE — Progress Notes (Signed)
Labor Progress Note  Katherine Rivera is a 27 y.o. G1P0000 at 51w3dby LMP [facility]  Subjective:  feeling more pressure now, and ready to push.   Objective: BP (!) 142/97   Pulse (!) 109   Temp 98.2 F (36.8 C) (Oral)   Resp 20   Ht '5\' 7"'$  (1.702 m)   Wt (!) 141.4 kg   LMP 05/23/2021 (Approximate) Comment: appointment with [facility] 7/14  SpO2 96%   BMI 48.82 kg/m  Notable VS details: reviewed Vitals:   02/09/22 1304 02/09/22 1401 02/09/22 1404 02/09/22 1504  BP: (!) 149/77 (!) 146/55 (!) 138/53 136/68   02/09/22 1604 02/09/22 1704 02/09/22 1805 02/09/22 1905  BP: (!) 150/88 (!) 157/91 (!) 148/78 (!) 162/90   02/09/22 1914 02/09/22 2005 02/09/22 2210 02/09/22 2319  BP: (!) 145/75 (!) 139/59 (!) 148/76 (!) 142/97   Lungs CTABL DTR2+, no clonus  Fetal Assessment: FHT:  FHR: 130 bpm, variability: moderate,  accelerations:  Present,  decelerations:  Present variables Category/reactivity:  Category II UC:   q2-4 min with poorly tracing Toco, PItocin at 10 mu/min SVE:   10/100/+1 - caput vs molding noted - normal bloody show noted.   Membrane status: SROM at 0708 Amniotic color: clear  Urine Output  Intake/Output Summary (Last 24 hours) at 02/09/2022 2333 Last data filed at 02/09/2022 1934 Gross per 24 hour  Intake 4463.15 ml  Output 2700 ml  Net 1763.15 ml      Labs: Lab Results  Component Value Date   WBC 10.5 02/09/2022   HGB 13.3 02/09/2022   HCT 38.1 02/09/2022   MCV 85.0 02/09/2022   PLT 181 02/09/2022    Assessment / Plan: G1P0000 at 382w3dIOL for GHTN-> Pre-E with severe features.   Labor: s.p Cytotec with intermittent cat II tracing, now on Pitocin and SROM at 0708 this morning.s/p IUPC and FSE, with IUPC replaced and again not working well. Fundus palpates soft between UCs. Toco in place but not tracing well.  Minimal descent since complete at 2155; pushing 1 hr then labor down x 3041mwith decreased epidural  rate. Dr BeaLeafy Rodated.  Preeclampsia: P/C ratio on admission 0.37, with elevated BP- dx Pre-E  severe range BP and HA-  Mag sulfate at 2gm/hr due to severe features.  Repeat labs stable. BP remains normal to mild range.  Fetal Wellbeing:  Category II Pain Control:  Epidural I/D:   GBS neg   RebFrancetta FoundNM 02/09/2022, 11:33 PM

## 2022-02-09 NOTE — Progress Notes (Signed)
Labor Check  Subj:  Complaints: }has no unusual complaints  Obj:      Cervix: Dilation: Closed / Effacement (%): Thick / Station: Ballotable  Baseline KXF:GHWEXHBZ: 145 bpm, Variability: Good {> 6 bpm), Accelerations: Reactive, and Decelerations: Absent Contractions: irregular, not tracing well on monitor    Current Vital Signs 24h Vital Sign Ranges  T 98.1 F (36.7 C) Temp  Avg: 98.1 F (36.7 C)  Min: 98.1 F (36.7 C)  Max: 98.1 F (36.7 C)  BP (!) 158/95 BP  Min: 142/89  Max: 175/87  HR 98 Pulse  Avg: 99.3  Min: 87  Max: 121  RR 18 Resp  Avg: 18  Min: 18  Max: 18  SaO2     No data recorded         A/P: 27 y.o. G1P0000 female at 83w3dwith Gestational Hypertension  1.  Labor: Not in labor. Labor  , preeclampsia labs stable, and pain controlled  Epidural 2.  FJIR:CVELFYBassessment: Category I 3.  Group B Strep negative 4. Membranes intact 5.  Pain: none 6.  Recheck:Evaluated by digital exam., Position: posterior, Dilation: 0cm, Thickness: 3 cm, and Consistency: firm 7. Intervention: IV Pitocin induction  FAvelino LeedsCVia Christi Clinic [facility]10/17/51026:16 AM

## 2022-02-09 NOTE — Anesthesia Preprocedure Evaluation (Signed)
Anesthesia Evaluation  Patient identified by MRN, date of birth, ID band Patient awake    Reviewed: Allergy & Precautions, H&P , NPO status , Patient's Chart, lab work & pertinent test results  Airway Mallampati: III  TM Distance: >3 FB Neck ROM: full    Dental no notable dental hx.    Pulmonary neg pulmonary ROS   Pulmonary exam normal        Cardiovascular Exercise Tolerance: Good hypertension, Normal cardiovascular exam  Preeclampsia with severe features on magnesium Gestational hypertension  Situs inversus   Neuro/Psych    GI/Hepatic negative GI ROS,,,  Endo/Other    Morbid obesity  Renal/GU   negative genitourinary   Musculoskeletal   Abdominal  (+) + obese  Peds  Hematology negative hematology ROS (+)   Anesthesia Other Findings Past Medical History: No date: Allergy 03/15/2020: Elevated ferritin 03/15/2020: Elevated hemoglobin (HCC) No date: Situs inversus No date: Vaccine for human papilloma virus (HPV) types 6, 11, 16, and  18 administered  Past Surgical History: No date: TONSILLECTOMY AND ADENOIDECTOMY No date: WISDOM TOOTH EXTRACTION  BMI    Body Mass Index: 48.82 kg/m      Reproductive/Obstetrics (+) Pregnancy                             Anesthesia Physical Anesthesia Plan  ASA: 3  Anesthesia Plan: Epidural   Post-op Pain Management:    Induction:   PONV Risk Score and Plan:   Airway Management Planned:   Additional Equipment:   Intra-op Plan:   Post-operative Plan:   Informed Consent: I have reviewed the patients History and Physical, chart, labs and discussed the procedure including the risks, benefits and alternatives for the proposed anesthesia with the patient or authorized representative who has indicated his/her understanding and acceptance.       Plan Discussed with: Anesthesiologist and CRNA  Anesthesia Plan Comments:         Anesthesia Quick Evaluation

## 2022-02-09 NOTE — Progress Notes (Signed)
Labor Check  Subj:  Complaints: }has no unusual complaints  Obj:      Cervix: Dilation: Closed / Effacement (%): Thick / Station: Ballotable  Baseline UXY:BFXOVANV: 145 bpm, Variability: Good {> 6 bpm), Accelerations: Reactive, and Decelerations: Variable: mild Contractions: regular, every 1-3 minutes    Current Vital Signs 24h Vital Sign Ranges  T 98.1 F (36.7 C) Temp  Avg: 98.1 F (36.7 C)  Min: 98.1 F (36.7 C)  Max: 98.1 F (36.7 C)  BP (!) 158/95 BP  Min: 142/89  Max: 175/87  HR 98 Pulse  Avg: 99.3  Min: 87  Max: 121  RR 18 Resp  Avg: 18  Min: 18  Max: 18  SaO2     No data recorded         A/P: 27 y.o. G1P0000 female at 57w3dwith Gestational Hypertension  1.  Labor: Not in labor. and having mild variables without contractions  preeclampsia labs stable 2.  FBTY:OMAYOKHassessment: Category II 3.  Group B Strep negative 4. Membranes intact 5.  Pain: cramping 6.  Recheck:Evaluated by digital exam. 7. Intervention: IV Pitocin induction  FAvelino LeedsCBhc [facility]19/97/74145:57 AM

## 2022-02-10 ENCOUNTER — Encounter
Admission: EM | Disposition: A | Discharge status: Home / Self Care | Payer: Self-pay | Source: Home / Self Care | Attending: Obstetrics

## 2022-02-10 ENCOUNTER — Encounter: Payer: Self-pay | Admitting: Obstetrics and Gynecology

## 2022-02-10 ENCOUNTER — Other Ambulatory Visit: Payer: Self-pay

## 2022-02-10 LAB — COMPREHENSIVE METABOLIC PANEL
ALT: 13 U/L (ref 0–44)
AST: 29 U/L (ref 15–41)
Albumin: 2.3 g/dL — ABNORMAL LOW (ref 3.5–5.0)
Alkaline Phosphatase: 72 U/L (ref 38–126)
Anion gap: 11 (ref 5–15)
BUN: 10 mg/dL (ref 6–20)
CO2: 18 mmol/L — ABNORMAL LOW (ref 22–32)
Calcium: 8.1 mg/dL — ABNORMAL LOW (ref 8.9–10.3)
Chloride: 104 mmol/L (ref 98–111)
Creatinine, Ser: 0.84 mg/dL (ref 0.44–1.00)
GFR, Estimated: >60 mL/min (ref >60)
Glucose, Bld: 136 mg/dL — ABNORMAL HIGH (ref 70–99)
Potassium: 4.1 mmol/L (ref 3.5–5.1)
Sodium: 133 mmol/L — ABNORMAL LOW (ref 135–145)
Total Bilirubin: 0.6 mg/dL (ref 0.3–1.2)
Total Protein: 5.7 g/dL — ABNORMAL LOW (ref 6.5–8.1)

## 2022-02-10 LAB — CBC
HCT: 32.4 % — ABNORMAL LOW (ref 36.0–46.0)
Hemoglobin: 11.2 g/dL — ABNORMAL LOW (ref 12.0–15.0)
MCH: 29.9 pg (ref 26.0–34.0)
MCHC: 34.6 g/dL (ref 30.0–36.0)
MCV: 86.6 fL (ref 80.0–100.0)
Platelets: 191 10*3/uL (ref 150–400)
RBC: 3.74 MIL/uL — ABNORMAL LOW (ref 3.87–5.11)
RDW: 13.5 % (ref 11.5–15.5)
WBC: 19.9 10*3/uL — ABNORMAL HIGH (ref 4.0–10.5)
nRBC: 0 % (ref 0.0–0.2)

## 2022-02-10 SURGERY — Surgical Case
Anesthesia: Epidural

## 2022-02-10 MED ORDER — KETOROLAC TROMETHAMINE 30 MG/ML IJ SOLN
30.0000 mg | Freq: Four times a day (QID) | INTRAMUSCULAR | Status: AC
  Administered 2022-02-10 – 2022-02-11 (×3): 30 mg via INTRAVENOUS
  Filled 2022-02-10 (×4): qty 1

## 2022-02-10 MED ORDER — MIDAZOLAM HCL 2 MG/2ML IJ SOLN
INTRAMUSCULAR | Status: AC
  Filled 2022-02-10: qty 2

## 2022-02-10 MED ORDER — DIBUCAINE (PERIANAL) 1 % EX OINT: 1.0000 | TOPICAL_OINTMENT | CUTANEOUS | Status: DC | PRN

## 2022-02-10 MED ORDER — FENTANYL CITRATE (PF) 100 MCG/2ML IJ SOLN
INTRAMUSCULAR | Status: AC
  Filled 2022-02-10: qty 2

## 2022-02-10 MED ORDER — LABETALOL HCL 200 MG PO TABS
200.0000 mg | ORAL_TABLET | Freq: Two times a day (BID) | ORAL | Status: DC
  Administered 2022-02-10 – 2022-02-11 (×2): 200 mg via ORAL
  Filled 2022-02-10: qty 1
  Filled 2022-02-10 (×2): qty 2
  Filled 2022-02-10: qty 1

## 2022-02-10 MED ORDER — WITCH HAZEL-GLYCERIN EX PADS: 1.0000 | MEDICATED_PAD | CUTANEOUS | Status: DC | PRN

## 2022-02-10 MED ORDER — SIMETHICONE 80 MG PO CHEW
80.0000 mg | CHEWABLE_TABLET | Freq: Three times a day (TID) | ORAL | Status: DC
  Administered 2022-02-10 – 2022-02-13 (×11): 80 mg via ORAL
  Filled 2022-02-10 (×11): qty 1

## 2022-02-10 MED ORDER — ONDANSETRON HCL 4 MG/2ML IJ SOLN
INTRAMUSCULAR | Status: AC
  Administered 2022-02-10: 4 mg via INTRAVENOUS
  Filled 2022-02-10: qty 2

## 2022-02-10 MED ORDER — FENTANYL CITRATE (PF) 100 MCG/2ML IJ SOLN
INTRAMUSCULAR | Status: DC | PRN
  Administered 2022-02-10: 100 ug via EPIDURAL

## 2022-02-10 MED ORDER — DIPHENHYDRAMINE HCL 25 MG PO CAPS: 25.0000 mg | ORAL_CAPSULE | Freq: Four times a day (QID) | ORAL | Status: DC | PRN

## 2022-02-10 MED ORDER — MIDAZOLAM HCL 2 MG/2ML IJ SOLN
INTRAMUSCULAR | Status: DC | PRN
  Administered 2022-02-10: 2 mg via INTRAVENOUS

## 2022-02-10 MED ORDER — OXYTOCIN-SODIUM CHLORIDE 30-0.9 UT/500ML-% IV SOLN
INTRAVENOUS | Status: AC
  Filled 2022-02-10: qty 500

## 2022-02-10 MED ORDER — TRANEXAMIC ACID-NACL 1000-0.7 MG/100ML-% IV SOLN
INTRAVENOUS | Status: DC | PRN
  Administered 2022-02-10: 1000 mg via INTRAVENOUS

## 2022-02-10 MED ORDER — BISACODYL 10 MG RE SUPP: 10.0000 mg | Freq: Every day | RECTAL | Status: DC | PRN

## 2022-02-10 MED ORDER — IBUPROFEN 600 MG PO TABS
600.0000 mg | ORAL_TABLET | Freq: Four times a day (QID) | ORAL | Status: DC
  Administered 2022-02-11 – 2022-02-13 (×8): 600 mg via ORAL
  Filled 2022-02-10 (×8): qty 1

## 2022-02-10 MED ORDER — KETAMINE HCL 50 MG/ML IJ SOLN
INTRAMUSCULAR | Status: AC
  Filled 2022-02-10: qty 10

## 2022-02-10 MED ORDER — SOD CITRATE-CITRIC ACID 500-334 MG/5ML PO SOLN
ORAL | Status: AC
  Administered 2022-02-10: 30 mL via ORAL
  Filled 2022-02-10: qty 15

## 2022-02-10 MED ORDER — LIDOCAINE HCL (PF) 2 % IJ SOLN
INTRAMUSCULAR | Status: DC | PRN
  Administered 2022-02-10 (×2): 100 mg via EPIDURAL
  Administered 2022-02-10: 40 mg via EPIDURAL
  Administered 2022-02-10 (×2): 100 mg via EPIDURAL

## 2022-02-10 MED ORDER — TETANUS-DIPHTH-ACELL PERTUSSIS 5-2.5-18.5 LF-MCG/0.5 IM SUSY
0.5000 mL | PREFILLED_SYRINGE | Freq: Once | INTRAMUSCULAR | Status: DC
  Filled 2022-02-10: qty 0.5

## 2022-02-10 MED ORDER — MENTHOL 3 MG MT LOZG: 1.0000 | LOZENGE | OROMUCOSAL | Status: DC | PRN

## 2022-02-10 MED ORDER — LACTATED RINGERS IV SOLN: INTRAVENOUS | Status: DC

## 2022-02-10 MED ORDER — ENOXAPARIN SODIUM 40 MG/0.4ML IJ SOSY
40.0000 mg | PREFILLED_SYRINGE | INTRAMUSCULAR | Status: DC
  Administered 2022-02-11 – 2022-02-13 (×3): 40 mg via SUBCUTANEOUS
  Filled 2022-02-10 (×4): qty 0.4

## 2022-02-10 MED ORDER — MORPHINE SULFATE (PF) 0.5 MG/ML IJ SOLN
INTRAMUSCULAR | Status: AC
  Filled 2022-02-10: qty 10

## 2022-02-10 MED ORDER — SOD CITRATE-CITRIC ACID 500-334 MG/5ML PO SOLN: 30.0000 mL | ORAL | Status: AC

## 2022-02-10 MED ORDER — SODIUM CHLORIDE 0.9 % IV SOLN
500.0000 mg | INTRAVENOUS | Status: AC
  Administered 2022-02-10: 500 mg via INTRAVENOUS
  Filled 2022-02-10: qty 5

## 2022-02-10 MED ORDER — PROPOFOL 10 MG/ML IV BOLUS
INTRAVENOUS | Status: AC
  Filled 2022-02-10: qty 20

## 2022-02-10 MED ORDER — SODIUM CHLORIDE 0.9% FLUSH: 20.0000 mL | Freq: Once | INTRAVENOUS | Status: DC

## 2022-02-10 MED ORDER — BUPIVACAINE HCL (PF) 0.25 % IJ SOLN
INTRAMUSCULAR | Status: DC | PRN
  Administered 2022-02-10: 40 mL

## 2022-02-10 MED ORDER — CEFAZOLIN IN SODIUM CHLORIDE 3-0.9 GM/100ML-% IV SOLN
3.0000 g | INTRAVENOUS | Status: AC
  Administered 2022-02-10: 3 g via INTRAVENOUS
  Filled 2022-02-10: qty 100

## 2022-02-10 MED ORDER — FERROUS SULFATE 325 (65 FE) MG PO TABS
325.0000 mg | ORAL_TABLET | Freq: Two times a day (BID) | ORAL | Status: DC
  Administered 2022-02-10 – 2022-02-13 (×8): 325 mg via ORAL
  Filled 2022-02-10 (×8): qty 1

## 2022-02-10 MED ORDER — SIMETHICONE 80 MG PO CHEW
80.0000 mg | CHEWABLE_TABLET | ORAL | Status: DC | PRN
  Administered 2022-02-11: 80 mg via ORAL
  Filled 2022-02-10: qty 1

## 2022-02-10 MED ORDER — MORPHINE SULFATE (PF) 0.5 MG/ML IJ SOLN
INTRAMUSCULAR | Status: DC | PRN
  Administered 2022-02-10: 3 mg via EPIDURAL

## 2022-02-10 MED ORDER — PROPOFOL 10 MG/ML IV BOLUS
INTRAVENOUS | Status: DC | PRN
  Administered 2022-02-10: 50 mg via INTRAVENOUS
  Administered 2022-02-10: 20 mg via INTRAVENOUS
  Administered 2022-02-10: 30 mg via INTRAVENOUS

## 2022-02-10 MED ORDER — COCONUT OIL OIL: 1.0000 | TOPICAL_OIL | Status: DC | PRN

## 2022-02-10 MED ORDER — BUPIVACAINE HCL (PF) 0.5 % IJ SOLN
60.0000 mL | Freq: Once | INTRAMUSCULAR | Status: DC
  Filled 2022-02-10: qty 60

## 2022-02-10 MED ORDER — PRENATAL MULTIVITAMIN CH
1.0000 | ORAL_TABLET | Freq: Every day | ORAL | Status: DC
  Administered 2022-02-10 – 2022-02-13 (×4): 1 via ORAL
  Filled 2022-02-10 (×4): qty 1

## 2022-02-10 MED ORDER — MEASLES, MUMPS & RUBELLA VAC IJ SOLR
0.5000 mL | Freq: Once | INTRAMUSCULAR | Status: DC
  Filled 2022-02-10: qty 0.5

## 2022-02-10 MED ORDER — ACETAMINOPHEN 500 MG PO TABS
1000.0000 mg | ORAL_TABLET | Freq: Four times a day (QID) | ORAL | Status: DC
  Administered 2022-02-10 – 2022-02-12 (×8): 1000 mg via ORAL
  Filled 2022-02-10 (×9): qty 2

## 2022-02-10 MED ORDER — ONDANSETRON HCL 4 MG/2ML IJ SOLN: 4.0000 mg | Freq: Once | INTRAMUSCULAR | Status: DC

## 2022-02-10 MED ORDER — DEXMEDETOMIDINE HCL IN NACL 80 MCG/20ML IV SOLN
INTRAVENOUS | Status: DC | PRN
  Administered 2022-02-10: 4 ug via BUCCAL
  Administered 2022-02-10 (×2): 8 ug via BUCCAL

## 2022-02-10 MED ORDER — OXYCODONE HCL 5 MG PO TABS
5.0000 mg | ORAL_TABLET | ORAL | Status: DC | PRN
  Administered 2022-02-11: 5 mg via ORAL
  Administered 2022-02-11: 10 mg via ORAL
  Administered 2022-02-12 – 2022-02-13 (×2): 5 mg via ORAL
  Filled 2022-02-10 (×2): qty 1
  Filled 2022-02-10 (×2): qty 2

## 2022-02-10 MED ORDER — ONDANSETRON HCL 4 MG/2ML IJ SOLN
INTRAMUSCULAR | Status: DC | PRN
  Administered 2022-02-10: 4 mg via INTRAVENOUS

## 2022-02-10 MED ORDER — FLEET ENEMA 7-19 GM/118ML RE ENEM: 1.0000 | ENEMA | Freq: Every day | RECTAL | Status: DC | PRN

## 2022-02-10 MED ORDER — DEXAMETHASONE SODIUM PHOSPHATE 4 MG/ML IJ SOLN
INTRAMUSCULAR | Status: DC | PRN
  Administered 2022-02-10: 10 mg via INTRAVENOUS

## 2022-02-10 MED ORDER — ONDANSETRON HCL 4 MG/2ML IJ SOLN: 4.0000 mg | Freq: Four times a day (QID) | INTRAMUSCULAR | Status: DC | PRN

## 2022-02-10 MED ORDER — MAGNESIUM SULFATE 40 GM/1000ML IV SOLN
INTRAVENOUS | Status: AC
  Filled 2022-02-10: qty 1000

## 2022-02-10 MED ORDER — FENTANYL CITRATE (PF) 100 MCG/2ML IJ SOLN
INTRAMUSCULAR | Status: DC | PRN
  Administered 2022-02-10 (×2): 50 ug via INTRAVENOUS

## 2022-02-10 MED ORDER — OXYTOCIN-SODIUM CHLORIDE 30-0.9 UT/500ML-% IV SOLN: 2.5000 [IU]/h | INTRAVENOUS | Status: AC

## 2022-02-10 MED ORDER — GABAPENTIN 300 MG PO CAPS
300.0000 mg | ORAL_CAPSULE | Freq: Every day | ORAL | Status: DC
  Administered 2022-02-11 – 2022-02-12 (×3): 300 mg via ORAL
  Filled 2022-02-10 (×3): qty 1

## 2022-02-10 MED ORDER — KETAMINE HCL 50 MG/ML IJ SOLN
INTRAMUSCULAR | Status: DC | PRN
  Administered 2022-02-10: 50 mg via INTRAMUSCULAR

## 2022-02-10 MED ORDER — SENNOSIDES-DOCUSATE SODIUM 8.6-50 MG PO TABS
2.0000 | ORAL_TABLET | ORAL | Status: DC
  Administered 2022-02-10 – 2022-02-13 (×3): 2 via ORAL
  Filled 2022-02-10 (×4): qty 2

## 2022-02-10 MED ORDER — SOD CITRATE-CITRIC ACID 500-334 MG/5ML PO SOLN
ORAL | Status: AC
  Filled 2022-02-10: qty 15

## 2022-02-10 SURGICAL SUPPLY — 30 items
CHLORAPREP W/TINT 26 (MISCELLANEOUS) ×1 IMPLANT
DRESSING PEEL AND PLAC PRVNA20 (GAUZE/BANDAGES/DRESSINGS) IMPLANT
DRSG PEEL AND PLACE PREVENA 20 (GAUZE/BANDAGES/DRESSINGS) ×1
DRSG TELFA 3X8 NADH STRL (GAUZE/BANDAGES/DRESSINGS) ×1 IMPLANT
ELECT REM PT RETURN 9FT ADLT (ELECTROSURGICAL) ×1
ELECTRODE REM PT RTRN 9FT ADLT (ELECTROSURGICAL) ×1 IMPLANT
GAUZE SPONGE 4X4 12PLY STRL (GAUZE/BANDAGES/DRESSINGS) ×1 IMPLANT
GOWN STRL REUS W/ TWL LRG LVL3 (GOWN DISPOSABLE) ×3 IMPLANT
GOWN STRL REUS W/TWL LRG LVL3 (GOWN DISPOSABLE) ×3
MANIFOLD NEPTUNE II (INSTRUMENTS) ×1 IMPLANT
MAT PREVALON FULL STRYKER (MISCELLANEOUS) ×1 IMPLANT
NDL HYPO 25GX1X1/2 BEV (NEEDLE) ×1 IMPLANT
NEEDLE HYPO 25GX1X1/2 BEV (NEEDLE) ×1 IMPLANT
NS IRRIG 1000ML POUR BTL (IV SOLUTION) ×1 IMPLANT
PACK C SECTION AR (MISCELLANEOUS) ×1 IMPLANT
PAD OB MATERNITY 4.3X12.25 (PERSONAL CARE ITEMS) ×1 IMPLANT
PAD PREP 24X41 OB/GYN DISP (PERSONAL CARE ITEMS) ×1 IMPLANT
SCRUB CHG 4% DYNA-HEX 4OZ (MISCELLANEOUS) ×1 IMPLANT
STAPLER INSORB 30 2030 C-SECTI (MISCELLANEOUS) IMPLANT
SUT MNCRL 4-0 (SUTURE) ×1
SUT MNCRL 4-0 27XMFL (SUTURE) ×1
SUT VIC AB 0 CT1 36 (SUTURE) ×2 IMPLANT
SUT VIC AB 0 CTX 36 (SUTURE) ×4
SUT VIC AB 0 CTX36XBRD ANBCTRL (SUTURE) ×2 IMPLANT
SUT VIC AB 2-0 SH 27 (SUTURE) ×3
SUT VIC AB 2-0 SH 27XBRD (SUTURE) ×2 IMPLANT
SUTURE MNCRL 4-0 27XMF (SUTURE) ×1 IMPLANT
SYR 30ML LL (SYRINGE) ×2 IMPLANT
TRAP FLUID SMOKE EVACUATOR (MISCELLANEOUS) ×1 IMPLANT
WATER STERILE IRR 500ML POUR (IV SOLUTION) ×1 IMPLANT

## 2022-02-10 NOTE — Progress Notes (Signed)
Provider discussed C/S with pt. Pt tearful but agreeable

## 2022-02-10 NOTE — Discharge Summary (Signed)
Obstetrical Discharge Summary  Patient Name: Katherine Rivera DOB: 1995/12/20 MRN: [id number]  Date of Admission: 02/08/2022 Date of Delivery: 02/10/22 Delivered by: Leafy Ro MD Date of Discharge: 02/13/22  Primary OB: [facility] [id number] last menstrual period was 05/23/2021 (approximate). EDC Estimated Date of Delivery: 02/27/22 Gestational Age at Delivery: [redacted]w[redacted]d  Antepartum complications:  Obesity, BMI 474GHTN Situs inversus totalis (patient) Family hx cardiac defects- pt Brother with single ventricle, 2 great vessels transposition, hole between the left and right atrium, pulmonary stenosis, sick sinus syndrome  Admitting Diagnosis: Gestational hypertension [O13.9]  Secondary Diagnosis: Preeclampsia with severe features, LTCS for arrest of descent; PP hemorrhage  Patient Active Problem List   Diagnosis Date Noted   Gestational hypertension 01/23/2022   Breech presentation, not applicable or unspecified fetus 01/06/2022   Family history of congenital heart defect 09/11/2021   Obesity affecting pregnancy in second trimester 09/11/2021   Supervision of high-risk pregnancy 09/09/2021   Supervision of high risk pregnancy in second trimester 09/09/2021   Vitamin D deficiency 03/15/2020   Posterior cervical lymphadenopathy- right  02/13/2020   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 Intermittent Cat II tracing, labor progressed spontaneously, Arrest of descent dx after pushing, see Op note for Cesarean delivery details.  Delivery Type: primary cesarean section, low transverse incision Anesthesia: epidural anesthesia Placenta: manual removal To Pathology: Yes  Laceration:  none Episiotomy: none Newborn Data: Live born female  Birth Weight: 6 lb 5.9 oz (2890 g) APGAR: 2, 5, 8  Newborn Delivery   Birth date/time: 02/10/2022 01:42:00 Delivery type: C-Section, Vacuum Assisted Trial of labor: Yes C-section categorization: Primary      Postpartum Procedures: transfusion IV Venofer Edinburgh:     02/11/2022    4:51 AM 08/01/2021    8:28 AM  Edinburgh Postnatal Depression Scale Screening Tool  I have been able to laugh and see the funny side of things. 1 0  I have looked forward with enjoyment to things. 1 0  I have blamed myself unnecessarily when things went wrong. 2 2  I have been anxious or worried for no good reason. 2 0  I have felt scared or panicky for no good reason. 0 0  Things have been getting on top of me. 2 2  I have been so unhappy that I have had difficulty sleeping. 1 0  I have felt sad or miserable. 2 0  I have been so unhappy that I have been crying. 2 0  The thought of harming myself has occurred to me. 0 0  Edinburgh Postnatal Depression Scale Total 13 4     Post partum course- Cesarean Section:  Patient had an uncomplicated postpartum course.  By time of discharge on POD#3, her pain was controlled on oral pain medications; she had appropriate lochia and was ambulating, voiding without difficulty, tolerating regular diet and passing flatus.   She was deemed stable for discharge to home.   Discharge Physical Exam:  BP 138/78 (BP Location: Right Arm)   Pulse 100   Temp 98.4 F (36.9 C) (Oral)   Resp 20   Ht '5\' 7"'$  (1.702 m)   Wt (!) 141.4 kg   LMP 05/23/2021 (Approximate) Comment: appointment with [facility] 7/14  SpO2 99%   Breastfeeding Unknown  BMI 48.82 kg/m   General: NAD CV: RRR Pulm: CTABL, nl effort ABD: s/nd/nt, fundus firm and below the umbilicus Lochia: moderate Incision: c/d/I, covered with Wound vac occlusive OP site dressing  DVT Evaluation: LE non-ttp, no evidence of DVT on exam.  Hemoglobin  Date Value Ref  Range Status  02/13/2022 8.2 (L) 12.0 - 15.0 g/dL Final  08/08/2021 13.7 11.1 - 15.9 g/dL Final   HCT  Date Value Ref Range Status  02/13/2022 24.7 (L) 36.0 - 46.0 % Final   Hematocrit  Date Value Ref Range Status  08/08/2021 40.5 34.0 - 46.6 % Final    Risk assessment for postpartum VTE and prophylactic treatment: Very high risk factors: None High risk factors: BMI 40-50 kg/m2 and Unscheduled cesarean after labor  Moderate risk factors: PPH > 1086m and Preeclampsia   Postpartum VTE prophylaxis with LMWH ordered  Disposition: stable, discharge to home. Baby Feeding: breast feeding Baby Disposition: home with mom  Rh Immune globulin indicated: No Rubella vaccine given: was not indicated Varivax vaccine given: was not indicated Flu vaccine given in AP setting: Yes  Tdap vaccine given in AP setting: Yes  RSV vaccine given AP: yes, 01/16/22  Contraception: condoms  Prenatal Labs:   Blood type/Rh A POS  Antibody screen Negative    Rubella 1.22 (07/21 0826)   Varicella Immune  RPR NON REACTIVE (01/05 1136)   HBsAg Negative (07/21 0826)  Hep C NR   HIV NON REACTIVE (01/05 1136)   GC neg  Chlamydia neg  Genetic screening cfDNA negative   1 hour GTT 93  3 hour GTT    GBS neg       Plan:  Katherine FRAYNEwas discharged to home in good condition. Follow-up appointment with delivering provider in 1 weeks.  Discharge Medications: Allergies as of 02/13/2022       Reactions   Latex Rash        Medication List     STOP taking these medications    aspirin EC 81 MG tablet   famotidine 20 MG tablet Commonly known as: PEPCID   folic acid 1 MG tablet Commonly known as: FOLVITE   ondansetron 4 MG tablet Commonly known as: ZOFRAN       TAKE these medications    acetaminophen 500 MG tablet Commonly known as: TYLENOL Take 2 tablets (1,000 mg total) by mouth every 6 (six) hours.   coconut oil Oil Apply 1 Application topically as needed.   dibucaine  1 % Oint Commonly known as: NUPERCAINAL Place 1 Application rectally as needed (postpartum hemorrhoids).   docusate sodium 100 MG capsule Commonly known as: COLACE Take 1 capsule (100 mg total) by mouth daily. Start taking on: February 14, 2022   enoxaparin 40 MG/0.4ML injection Commonly known as: LOVENOX Inject 0.4 mLs (40 mg total) into the skin daily for 21 days. Start taking on: February 14, 2022   ferrous sulfate 325 (65 FE) MG tablet Take 1 tablet (325 mg total) by mouth 2 (two) times daily with a meal.   ibuprofen 600 MG tablet Commonly known as: ADVIL Take 1 tablet (600 mg total) by mouth every 6 (six) hours.   oxyCODONE 5 MG immediate release tablet Commonly known as: Oxy IR/ROXICODONE Take 1 tablet (5 mg total) by mouth every 4 (four) hours as needed for moderate pain.   PRENATAL 1 PO   senna-docusate 8.6-50 MG tablet Commonly known as: Senokot-S Take 2 tablets by mouth daily. Start taking on: February 14, 2022   simethicone 80 MG chewable tablet Commonly known as: MYLICON Chew 1 tablet (80 mg total) by mouth as needed for flatulence.   Vitamin D 125 MCG (5000 UT) Caps   witch hazel-glycerin pad Commonly known as: TUCKS Apply 1 Application topically as needed for hemorrhoids.         Follow-up Information     Benjaman Kindler, MD Follow up on 02/24/2022.   Specialty: Obstetrics and Gynecology Why: post-op check Contact information: [place] [address] [phone]         Minda Meo, CNM. Go on 02/16/2022.   Specialty: Certified Nurse Midwife Why: blood pressure check and wound vac Contact information: [place] [address] [phone]                 Signed: Avelino Leeds CNM

## 2022-02-10 NOTE — Progress Notes (Signed)
Post Partum Day 0 Subjective: Doing well, no complaints.  Tolerating regular diet, pain with PO meds.  No CP SOB Fever,Chills, N/V or leg pain; denies nipple or breast pain, no HA change of vision, RUQ/epigastric pain  Objective: BP (!) 156/84   Pulse (!) 110   Temp 98.3 F (36.8 C) (Oral)   Resp 20   Ht '5\' 7"'$  (1.702 m)   Wt (!) 141.4 kg   LMP 05/23/2021 (Approximate) Comment: appointment with [facility] 7/14  SpO2 96%   Breastfeeding Unknown   BMI 48.82 kg/m    Vitals:   02/10/22 0315 02/10/22 0408 02/10/22 0415 02/10/22 0430  BP: (!) 138/94 117/82 123/80 126/88   02/10/22 0445 02/10/22 0500 02/10/22 0515 02/10/22 0516  BP: 123/78 125/82 128/83 128/83   02/10/22 0553 02/10/22 0654 02/10/22 0722 02/10/22 0822  BP: 135/81 (!) 157/72 (!) 140/83 (!) 156/84    Physical Exam:  General: NAD Breasts: soft/nontender CV: RRR Pulm: nl effort, CTABL Abdomen: soft, NT, BS x 4 Incision: Dsg CDI/wound vac in place Lochia: moderate Uterine Fundus: fundus firm and 1 fb below umbilicus DVT Evaluation: no cords, ttp LEs   Recent Labs    02/09/22 0917 02/10/22 0607  HGB 13.3 11.2*  HCT 38.1 32.4*  WBC 10.5 19.9*  PLT 181 191   I/O last 3 completed shifts: In: 6864.8 [P.O.:1450; I.V.:4615.2; Other:149.6; IV Piggyback:650] Out: 8550 [Urine:4410; Blood:4140] Total I/O In: 719.6 [P.O.:505; I.V.:214.6] Out: 200 [Urine:200]   Assessment/Plan: 27 y.o. G1P1001 postpartum day # 0  - Continue routine PP care  - pre-eclampsia: no severe range blood pressures since delivery; magnesium infusing at 2g/hr, no headaches, changes of vision, or RUQ pain; good urine output - Lactation consult PRN - Acute blood loss anemia - hemodynamically stable and asymptomatic; start po ferrous sulfate BID with stool softeners  - Immunization status: all Imms up to date  Disposition: Does not desire Dc home today.   Gertie Fey, CNM 02/10/2022 9:00 AM

## 2022-02-10 NOTE — Lactation Note (Signed)
This note was copied from a baby's chart. Lactation Consultation Note  Patient Name: Katherine Rivera OILNZ'V Date: 02/10/2022 Reason for consult: Initial assessment;Early term 37-38.6wks;Primapara;Other (Comment) (initiate pumping. Baby in SCN.) Age:27 hours  Maternal Data This is mom's 1st baby, mom with IOL for GHTN, C/S with vacuum assist for arrest of descent. Mom with acute blood loss anemia(intrapartum hemorrhage with 2030 ml blood loss). Mom with preeclampsia currently in L&D. Mom with history of GHTN in 3rd trimester, obesity, situs inversus totalis. Baby born at 40 4/7 weeks transferred to [facility] for episodes of oxygen desaturation.  On first visit mom reports her plan is to breastfeed. Discussed with mom the importance of initiating breastpumping/hand expression to establish her milk supply. Mom agreeable to begin pumping.Dad at bedside very supportive. Has patient been taught Hand Expression?: Yes Does the patient have breastfeeding experience prior to this delivery?: No  Feeding Mother's Current Feeding Choice: Breast Milk   Lactation Tools Discussed/Used Tools: Pump Breast pump type: Double-Electric Breast Pump Pump Education: Setup, frequency, and cleaning;Milk Storage Reason for Pumping: Mother-baby separation, baby in SCN and is NPO Pumping frequency: goal of 8 times in 24 hours. Pumped volume:  (Mother has pumping drops of colostrum.) Mom and dad pleased mom was able to express some colostrum.  Interventions Interventions: DEBP;Education, support and encouragement provided.  Discharge Pump: Personal (Medela)  Consult Status Consult Status: Follow-up from L&D  Update provided to care nurse.  Katherine Rivera 02/10/2022, 4:03 PM

## 2022-02-10 NOTE — Progress Notes (Signed)
Patient ID: Katherine Rivera, female   DOB: 05/10/1995, 27 y.o.   MRN: [id number] 26yo G1P0 at 81w4dwith iol for gHTN, with severe range pressures during labor and now on mag, who has been pushing without descent and cat II FHT. Arrest of descent.   The risks of cesarean section discussed with the patient included but were not limited to: bleeding which may require transfusion or reoperation; infection which may require antibiotics; injury to bowel, bladder, ureters or other surrounding organs; injury to the fetus; need for additional procedures including hysterectomy in the event of a life-threatening hemorrhage; placental abnormalities wth subsequent pregnancies, incisional problems, thromboembolic phenomenon and other postoperative/anesthesia complications. The patient concurred with the proposed plan, giving informed written consent for the procedure.    Anesthesia and OR aware. Preoperative prophylactic antibiotics and SCDs ordered on call to the OR.  To OR when ready.

## 2022-02-10 NOTE — Progress Notes (Signed)
NO fetal descent after almost 2hours of pushing, last 86mn with excellent maternal effort.   Dr bLeafy Ronotified, [facility]   RFrancetta Found CNM 02/10/2022 12:12 AM

## 2022-02-10 NOTE — Op Note (Addendum)
Cesarean Section Procedure Note  Date of procedure: 02/10/2022   Pre-operative Diagnosis: Intrauterine pregnancy at [redacted]w[redacted]d  - preE with severe features on magnesium - failed induction - arrest of descent -Body mass index is 48.82 kg/m. - maternal situs inversus totalis  Post-operative Diagnosis: same, delivered, plus  -Intrapartum hemorrhage -Deeply impacted fetal head in the OP position -Right cervical and vaginal extension  Modifier 22 for difficulty and acuity of the procedure.  An experienced assistant was critical given the standard of surgical care given the complexity of the case.  This assistant was needed for exposure, dissection, suctioning, retraction, instrument exchange, RHassan Buckler CNM assisting with delivery with administration of fundal pressure and for overall help during the procedure.   Procedure: Primary Low Transverse Cesarean Section through Pfannenstiel incision  Surgeon: BBenjaman Kindler MD  Assistant(s):  RHassan Buckler CNM   Anesthesia: Epidural anesthesia, Spinal anesthesia, and IV addition  Anesthesiologist: OIlene Qua MD Anesthesiologist: JIran Ouch MD; OIlene Qua MD CRNA: BAline Brochure CRNA  Estimated Blood Loss:   20358m        Drains: foley         Total IV Fluids: 145027motal  Urine Output: 150 ml dark urine         Specimens: cord arterial gas attempted to be collected, but failed         Complications:  Hemorrhage, insufficient pain control         Disposition: PACU - hemodynamically stable.         Condition: stable  Findings:  A female infant in OP extended presentation. Amniotic fluid - Clear  Birth weight 2890 g, 6#4oz.  Apgars of 2 and 5 and 8 at one and five and ten minutes respectively.   Intact placenta with a thin three-vessel cord. The nuchal cord was wrapped at least once.  Deep fetal head impaction, with an acute curve of Carus, and the baby's OP head hyperextended into almost a face  presentation. Significant fetal molding, asynclitic caput.  Right-sided hysterotomy extension past the cervix with significant bleeding, which worsened visualization due to the deep angle, deep subc tissue.   Grossly normal uterus, tubes and ovaries bilaterally.   Indications: 26y17yoP0 at 37w34w4dh iol for gHTN, with severe range pressures during labor and now on mag, who has been pushing without descent and cat II FHT with variables. Arrest of descent.    Procedure Details  The patient was taken to Operating Room, identified as the correct patient and the procedure verified as C-Section Delivery. A formal Time Out was held with all team members present and in agreement.  After induction of anesthesia, the patient was draped and prepped in the usual sterile manner. A Pfannenstiel skin incision was made and carried down through the subcutaneous tissue to the fascia. Fascial incision was made and extended transversely with the Mayo scissors. The fascia was separated from the underlying rectus tissue superiorly and inferiorly. The peritoneum was identified and entered bluntly. Peritoneal incision was extended longitudinally. The utero-vesical peritoneal reflection was incised transversely and a bladder flap was created digitally. Alexis placed, Traxi not needed.  A low transverse hysterotomy was made.  The hysterotomy was at the level of the fetal shoulder, and the left arm popped out immediately, tangled in the umbilical cord.  The pad accordion delivery maneuver was attempted without success, and the fetal head did not move at all.  The arm was replaced and a breech extraction was attempted, but due  to the back down position and an interfering placenta with a tightly contracted uterus, only 1 foot was able to be extracted.  This foot was replaced, and the request for a vaginal hand from below was made.  Very quickly, the head was able to be disimpacted from the pelvis, and the baby delivered with  fundal pressure headfirst with its left arm and shoulder out prior to delivery.  The cord did prolapse out for most of these minutes.  The cord was unwell and from the baby's neck and shoulder and arm, immediately clamped and cut, and passed to the waiting pediatrics team.  Cord arterial gas was attempted to be collected on the field, but this was unsuccessful.  The placenta delivered spontaneously while the cord was being clamped and cut.  The uterus was exteriorized and cleared of clot and debris. The hysterotomy was closed with running sutures of 0-Vicryl.  A right sided hysterotomy extension was noted, and this was closed in a running locked fashion, walking the suture down and to confirm the apex.  Throughout the case, the pelvic sidewall and the bladder were maintained out of the way.  This step of the case was the most difficult, because it was deep in the pelvis and the subcu tissue depth limited visualization.  Significant bleeding also limited visualization from both the hysterotomy at the left angle and in the midline, as well as the extension.  However, with excellent and critical bedside assistance x 2, and working suction, this extension was closed and the hysterotomy was closed in a double layer.  At this portion of the procedure, the anesthesia was no longer adequate, and the patient did receive IV Versed and ketamine.  Her partner was at the bedside throughout the procedure.  After repair, all angles of the uterus were examined.  We were unable to suction behind the uterus due to the depth of the pelvis, but we did clear the bilateral paracolic gutters of blood and debris after the uterus was replaced.  Excellent hemostasis was observed.   The fascia was then reapproximated with running sutures of 0 Maxon x2 with the knots in the middle.  The subcutaneous tissue was reapproximated with running sutures of 0 chromic. The skin was reapproximated with Ensorb absorbable staples. 30 of 0.5%  bupivicaine in 33m of NSS was placed in the fascial and skin lines.  A Prevena wound VAC was placed, as she was consistently oozy after staple placement.  Instrument, sponge, and needle counts were correct prior to the abdominal closure and at the conclusion of the case.   The patient tolerated the procedure well and was transferred to the recovery room in stable condition.   She will continue the magnesium for 24 hours.  We will recheck a CBC and administer blood products as necessary.  She remain on labor and delivery until ambulatory and passed the dangerous postoperative period.  BBenjaman Kindler MD 02/10/2022 3:22 AM

## 2022-02-10 NOTE — Transfer of Care (Signed)
Immediate Anesthesia Transfer of Care Note  Patient: Katherine Rivera  Procedure(s) Performed: CESAREAN SECTION  Patient Location:  LDR5  Anesthesia Type:Epidural  Level of Consciousness: awake  Airway & Oxygen Therapy: Patient Spontanous Breathing  Post-op Assessment: Report given to RN and Post -op Vital signs reviewed and stable  Post vital signs: Reviewed and stable  Last Vitals:  Vitals Value Taken Time  BP 138/94 02/10/22 0314  Temp    Pulse 104 02/10/22 0315  Resp    SpO2 97 % 02/10/22 0315  Vitals shown include unvalidated device data.  Last Pain:  Vitals:   02/09/22 2035  TempSrc:   PainSc: 6       Patients Stated Pain Goal: 0 (74/12/87 8676)  Complications: No notable events documented.

## 2022-02-11 LAB — COMPREHENSIVE METABOLIC PANEL
ALT: 10 U/L (ref 0–44)
AST: 19 U/L (ref 15–41)
Albumin: 2.1 g/dL — ABNORMAL LOW (ref 3.5–5.0)
Alkaline Phosphatase: 55 U/L (ref 38–126)
Anion gap: 6 (ref 5–15)
BUN: 14 mg/dL (ref 6–20)
CO2: 22 mmol/L (ref 22–32)
Calcium: 7.5 mg/dL — ABNORMAL LOW (ref 8.9–10.3)
Chloride: 107 mmol/L (ref 98–111)
Creatinine, Ser: 0.93 mg/dL (ref 0.44–1.00)
GFR, Estimated: >60 mL/min (ref >60)
Glucose, Bld: 102 mg/dL — ABNORMAL HIGH (ref 70–99)
Potassium: 3.7 mmol/L (ref 3.5–5.1)
Sodium: 135 mmol/L (ref 135–145)
Total Bilirubin: 0.6 mg/dL (ref 0.3–1.2)
Total Protein: 5.1 g/dL — ABNORMAL LOW (ref 6.5–8.1)

## 2022-02-11 LAB — CBC
HCT: 24.1 % — ABNORMAL LOW (ref 36.0–46.0)
Hemoglobin: 8.2 g/dL — ABNORMAL LOW (ref 12.0–15.0)
MCH: 30 pg (ref 26.0–34.0)
MCHC: 34 g/dL (ref 30.0–36.0)
MCV: 88.3 fL (ref 80.0–100.0)
Platelets: 159 10*3/uL (ref 150–400)
RBC: 2.73 MIL/uL — ABNORMAL LOW (ref 3.87–5.11)
RDW: 14.1 % (ref 11.5–15.5)
WBC: 13.7 10*3/uL — ABNORMAL HIGH (ref 4.0–10.5)
nRBC: 0 % (ref 0.0–0.2)

## 2022-02-11 LAB — CBC WITH DIFFERENTIAL/PLATELET
Abs Immature Granulocytes: 0.05 10*3/uL (ref 0.00–0.07)
Basophils Absolute: 0 10*3/uL (ref 0.0–0.1)
Basophils Relative: 0 %
Eosinophils Absolute: 0.1 10*3/uL (ref 0.0–0.5)
Eosinophils Relative: 1 %
HCT: 24.7 % — ABNORMAL LOW (ref 36.0–46.0)
Hemoglobin: 8.4 g/dL — ABNORMAL LOW (ref 12.0–15.0)
Immature Granulocytes: 0 %
Lymphocytes Relative: 11 %
Lymphs Abs: 1.4 10*3/uL (ref 0.7–4.0)
MCH: 30.2 pg (ref 26.0–34.0)
MCHC: 34 g/dL (ref 30.0–36.0)
MCV: 88.8 fL (ref 80.0–100.0)
Monocytes Absolute: 0.6 10*3/uL (ref 0.1–1.0)
Monocytes Relative: 4 %
Neutro Abs: 10.9 10*3/uL — ABNORMAL HIGH (ref 1.7–7.7)
Neutrophils Relative %: 84 %
Platelets: 178 10*3/uL (ref 150–400)
RBC: 2.78 MIL/uL — ABNORMAL LOW (ref 3.87–5.11)
RDW: 14.4 % (ref 11.5–15.5)
WBC: 13.1 10*3/uL — ABNORMAL HIGH (ref 4.0–10.5)
nRBC: 0 % (ref 0.0–0.2)

## 2022-02-11 MED ORDER — METHYLERGONOVINE MALEATE 0.2 MG PO TABS: 0.2000 mg | ORAL_TABLET | ORAL | Status: DC | PRN

## 2022-02-11 MED ORDER — CALCIUM CARBONATE ANTACID 500 MG PO CHEW
1.0000 | CHEWABLE_TABLET | ORAL | Status: DC | PRN
  Administered 2022-02-11: 200 mg via ORAL

## 2022-02-11 MED ORDER — METHYLERGONOVINE MALEATE 0.2 MG/ML IJ SOLN: 0.2000 mg | INTRAMUSCULAR | Status: DC | PRN

## 2022-02-11 MED ORDER — SODIUM CHLORIDE 0.9 % IV SOLN: INTRAVENOUS | Status: DC | PRN

## 2022-02-11 MED ORDER — SODIUM CHLORIDE 0.9 % IV SOLN
300.0000 mg | Freq: Once | INTRAVENOUS | Status: AC
  Administered 2022-02-11: 300 mg via INTRAVENOUS
  Filled 2022-02-11: qty 300

## 2022-02-11 NOTE — Lactation Note (Signed)
This note was copied from a baby's chart. Lactation Consultation Note  Patient Name: Katherine Rivera Date: 02/11/2022 Reason for consult: Follow-up assessment;Early term 37-38.6wks;Other (Comment);Primapara (Baby in SCN.) Age:27 hours  Maternal Data This is mom's 1st baby, mom with IOL for GHTN, C/S with vacuum assist for arrest of descent. Mom with acute blood loss anemia(intrapartum hemorrhage with 2030 ml blood loss).  Mom with history of GHTN in 3rd trimester, preeclampsia, obesity, and situs inversus totalis. Baby born at 52 4/7 weeks transferred to [facility] for episodes of oxygen desaturation.   Today on follow-up mom reports she has not expressed any colostrum since her 2nd pump session. Per mom "My hemoglobin dipped again."   Has patient been taught Hand Expression?: Yes Does the patient have breastfeeding experience prior to this delivery?: No  Feeding Mother's Current Feeding Choice: Breast Milk and Formula Nipple Type: Dr. Clement Husbands   Lactation Tools Discussed/Used Tools: Pump Breast pump type: Double-Electric Breast Pump Pump Education: Other (comment) (completed education yesterday) Reason for Pumping: Baby in SCN Pumping frequency: goal of 8 times in 24 hours. Pumped volume:  (Mom reports she hasn't been able to express any measureable amounts of colostrum since her 2nd pump yesterday.)  Interventions Interventions: Education Discussed with mom to continue pumping despite not expressing colostrum and it is not uncommon to express little or nothing in the first 3 days. Revisited her body is recovering from an acute blood loss anemia which can interfere with milk production. Support and encouragement provided.  Discharge Pump: Personal (Medela)  Consult Status Consult Status: Follow-up Date: 02/12/22 Follow-up type: In-patient  Update provided to care nurse.  [name] 02/11/2022, 12:08 PM

## 2022-02-11 NOTE — Progress Notes (Signed)
Post Partum Day 1  Subjective: Feeling dizzy  Pain managed with PO meds, tolerating regular diet, and voiding without difficulty.   No fever/chills, chest pain, shortness of breath, nausea/vomiting, or leg pain. No nipple or breast pain. No headache, visual changes, or RUQ/epigastric pain.  Objective: BP (!) 92/55 (BP Location: Right Arm)   Pulse 100   Temp 98.7 F (37.1 C) (Axillary)   Resp 18   Ht '5\' 7"'$  (1.702 m)   Wt (!) 141.4 kg   LMP 05/23/2021 (Approximate) Comment: appointment with [facility] 7/14  SpO2 100%   Breastfeeding Unknown   BMI 48.82 kg/m    Physical Exam:  General: alert and cooperative Breasts: soft/nontender CV: RRR Pulm: nl effort Abdomen: soft, non-tender Uterine Fundus: firm Incision: wound vac in place, working, no drainage noted Perineum: intact Lochia: appropriate DVT Evaluation: No evidence of DVT seen on physical exam. Edinburgh:     02/11/2022    4:51 AM 08/01/2021    8:28 AM  Edinburgh Postnatal Depression Scale Screening Tool  I have been able to laugh and see the funny side of things. 1 0  I have looked forward with enjoyment to things. 1 0  I have blamed myself unnecessarily when things went wrong. 2 2  I have been anxious or worried for no good reason. 2 0  I have felt scared or panicky for no good reason. 0 0  Things have been getting on top of me. 2 2  I have been so unhappy that I have had difficulty sleeping. 1 0  I have felt sad or miserable. 2 0  I have been so unhappy that I have been crying. 2 0  The thought of harming myself has occurred to me. 0 0  Edinburgh Postnatal Depression Scale Total 13 4     Recent Labs    02/10/22 0607 02/11/22 0547  HGB 11.2* 8.2*  HCT 32.4* 24.1*  WBC 19.9* 13.7*  PLT 191 159    Assessment/Plan: 27 y.o. G1P1001 postpartum day # 1  Baby in NICU, may be able to room in tomorrow  Hypotensive - EBL at delivery 2037m - Hgb decreased to 8.2 after delivery from 11.2 - Venofer '300mg'$   started at 0926 - Complains of dizziness even when laying bed, denies tachycardia or HA - Dr. JGlennon Macnotified and he asked for orthostatic BPs and another CBC at 1200 - Orthostatic BPs - 116/69  P 94; 115/64  P 104;  124/54  P 120 - Output 0700-1000 8068m 1001-1100 30042m 275m15m  Pre-E - Competed Mag Sulfate x 24 hours 0124 this am - Labetalol ordered and given 1/23 '@1539'$  and 1/24 @ 0155 - next dose held Vitals:   02/10/22 1522 02/10/22 1622 02/10/22 1722 02/10/22 1823  BP: (!) 142/81 124/62 136/87 94/64   02/10/22 2022 02/10/22 2122 02/10/22 2223 02/10/22 2238  BP: 133/68 (!) 103/56 (!) 93/35 (!) 100/46   02/10/22 2322 02/11/22 0345 02/11/22 0430 02/11/22 0752  BP: (!) 102/47 (!) 105/58 108/60 (!) 92/55     -Continue routine postpartum care -Lactation consult PRN for breastfeeding   -Acute blood loss anemia - see above  -Immunization status: all immunizations up to date  Disposition: Continue inpatient postpartum care    LOS: 3 days   Jovi Alvizo, CNM 02/11/2022, 11:08 AM

## 2022-02-11 NOTE — Anesthesia Postprocedure Evaluation (Signed)
Anesthesia Post Note  Patient: Katherine Rivera  Procedure(s) Performed: [place]  Patient location during evaluation: Mother Baby Anesthesia Type: Epidural Level of consciousness: oriented and awake and alert Pain management: pain level controlled Vital Signs Assessment: post-procedure vital signs reviewed and stable Respiratory status: spontaneous breathing and respiratory function stable Cardiovascular status: blood pressure returned to baseline and stable Postop Assessment: no headache, no backache, no apparent nausea or vomiting and able to ambulate Anesthetic complications: no   No notable events documented.   Last Vitals:  Vitals:   02/11/22 0430 02/11/22 0752  BP: 108/60 (!) 92/55  Pulse: 88 100  Resp: 20 18  Temp: 36.9 C 37.1 C  SpO2: 99% 100%    Last Pain:  Vitals:   02/11/22 0752  TempSrc: Axillary  PainSc:                  Alison Stalling

## 2022-02-12 MED ORDER — ACETAMINOPHEN 500 MG PO TABS
1000.0000 mg | ORAL_TABLET | Freq: Four times a day (QID) | ORAL | Status: DC
  Administered 2022-02-12 – 2022-02-13 (×4): 1000 mg via ORAL
  Filled 2022-02-12 (×4): qty 2

## 2022-02-12 MED ORDER — DOCUSATE SODIUM 100 MG PO CAPS
100.0000 mg | ORAL_CAPSULE | Freq: Every day | ORAL | Status: DC
  Administered 2022-02-12: 100 mg via ORAL
  Filled 2022-02-12: qty 1

## 2022-02-12 NOTE — Progress Notes (Signed)
Postop Day  2  Subjective: up ad lib, voiding, and tolerating PO. Visiting infant in SCN.  Feeling tired but improving today.  Doing well, no concerns. Ambulating without difficulty, pain managed with PO meds, tolerating regular diet, and voiding without difficulty.   No fever/chills, chest pain, shortness of breath, nausea/vomiting, or leg pain. No nipple or breast pain. No headache, visual changes, or RUQ/epigastric pain.  Objective: BP 134/76 (BP Location: Right Arm)   Pulse (!) 107   Temp 97.9 F (36.6 C) (Oral)   Resp 20   Ht '5\' 7"'$  (1.702 m)   Wt (!) 141.4 kg   LMP 05/23/2021 (Approximate) Comment: appointment with [facility] 7/14  SpO2 99%   Breastfeeding Unknown   BMI 48.82 kg/m   Vitals:   02/10/22 2223 02/10/22 2238 02/10/22 2322 02/11/22 0345  BP: (!) 93/35 (!) 100/46 (!) 102/47 (!) 105/58   02/11/22 0430 02/11/22 0752 02/11/22 1303 02/11/22 1622  BP: 108/60 (!) 92/55 127/85 131/78   02/11/22 1940 02/11/22 2224 02/12/22 0412 02/12/22 0740  BP: (!) 144/57 128/72 (!) 117/56 134/76      Physical Exam:  General: alert, cooperative, no distress, and pale Breasts: soft/nontender CV: RRR Pulm: nl effort, CTABL Abdomen: soft, non-tender, active bowel sounds Uterine Fundus: firm Incision: no significant drainage, covered with occlusive Proveena dressing  Perineum: minimal edema, intact Lochia: appropriate DVT Evaluation: No evidence of DVT seen on physical exam.  Recent Labs    02/11/22 0547 02/11/22 1424  HGB 8.2* 8.4*  HCT 24.1* 24.7*  WBC 13.7* 13.1*  PLT 159 178    Assessment/Plan: 27 y.o. G1P1001 postpartum day # 2  -Continue routine postpartum care -Lactation consult PRN for breastfeeding.  Pumping for infant in SCN -Acute blood loss anemia - hemodynamically stable and asymptomatic; start PO ferrous sulfate BID with stool softeners  -s/p Venofer infusion - hypotensive reaction  -Labetalol discontinued - bp's WNL -Immunization status:   all  immunizations up to date   Disposition: Continue inpatient postpartum care   LOS: 4 days   Minda Meo, CNM 02/12/2022, 9:35 AM   ----- Drinda Butts  Certified Nurse Midwife [facility]

## 2022-02-12 NOTE — Discharge Instructions (Addendum)
     Discharge instructions:   Call office if you have any of the following:  headache, visual changes, fever >101.0 F, chills, breast concerns (engorgement, mastitis) excessive vaginal bleeding, incision drainage or problems, leg pain or redness, depression or any other concerns.   Activity: Do not lift > 10 lbs for 6 weeks.  No intercourse or tampons for 6 weeks.  No driving for 1-2 weeks or while taking pain medication. No strenuous activity or heavy lifting for 6 weeks.  No swimming pools, hot tubs or tub baths- showers only.    It is normal to bleed for up to 6 weeks. You should not soak through more than 1 pad in 1 hour.   Continue prenatal vitamin. Increase calories and fluids while breastfeeding.  Your milk will come in, in the next couple of days (right now it is colostrum).  You may have a slight fever when your milk comes in, but it should go away on its own.   If it does not, and rises above 101 F please call the doctor.  You will also feel achy and your breasts will be firm. They will also start to leak.  If you are breastfeeding, continue as you have been and you can pump/express milk for comfort.   For concerns about your baby, please call your pediatrician For breastfeeding concerns, the lactation consultant can be reached at [phone]  Postpartum blues (feelings of happy one minute and sad another minute) are normal for the first few weeks but if it gets worse let your doctor know.

## 2022-02-12 NOTE — Lactation Note (Signed)
This note was copied from a baby's chart. Lactation Consultation Note  Patient Name: Boy Jerrine Urschel HAFBX'U Date: 02/12/2022   Age:27 hours  Maternal Data    Feeding    LATCH Score Latch: Too sleepy or reluctant, no latch achieved, no sucking elicited.  Audible Swallowing: None  Type of Nipple: Everted at rest and after stimulation  Comfort (Breast/Nipple): Soft / non-tender  Hold (Positioning): Assistance needed to correctly position infant at breast and maintain latch.  LATCH Score: 5   Lactation Tools Discussed/Used    Interventions  Mom reports pumping with no problems about every 4 hours. Has made a decision to continue pumping but will feed infant formula and any expressed breast milk by bottle rather then at the breast until infant has improved stamina. Mom was tearful when asked how she was feeling. Self-care and rest encouraged. Outpatient lactation services offered if she would like to transition back to breast once the couplet is home.   Discharge    Consult Status      Lenore Cordia 02/12/2022, 8:51 PM

## 2022-02-12 NOTE — Clinical Social Work Maternal (Addendum)
  CLINICAL SOCIAL WORK MATERNAL/CHILD NOTE  Patient Details  Name: Katherine Rivera MRN: [id number] Date of Birth: [birth year]  Date:  02/12/2022  Clinical Social Worker Initiating Note:  Doran Clay RN BSN Case Manager Date/Time: Initiated:  02/12/22/1200     Child's Name:  Katherine Rivera   Biological Parents:  Mother, Father   Need for Interpreter:  None   Reason for Referral:  Behavioral Health Concerns Flavia Shipper score)   Address:  [place] [address]    Phone number:  [phone] (home)     Additional phone number: NA  Household Members/Support Persons (HM/SP):   Household Member/Support Person 1   HM/SP Name Relationship DOB or Age  HM/SP -1 Theodoro Kalata Spouse    HM/SP -2        HM/SP -3        HM/SP -4        HM/SP -5        HM/SP -6        HM/SP -7        HM/SP -8          Natural Supports (not living in the home):      Professional Supports:     Employment: Animator   Type of Work: Geneticist, molecular   Education:  [place] arranged:    Museum/gallery curator Resources:  Multimedia programmer    Other Resources:      Cultural/Religious Considerations Which May Impact Care:  NA  Strengths:  Ability to meet basic needs  , Compliance with medical plan  , Home prepared for child  , Understanding of illness   Psychotropic Medications:         Pediatrician:       Pediatrician List:   [place]      Pediatrician Fax Number:    Risk Factors/Current Problems:  Other (Comment) (Baby currently in the NICU)   Cognitive State:  Alert  , Able to Concentrate     Mood/Affect:  Overwhelmed     CSW Assessment: TOC consult for Edinburgh Score of 13.  RNCM was able to speak with patient and her husband in the special care nursery.  Father of baby holding newborn in the SCN, mom sitting by his side.  It is apparent that mom is  concerned and has worried.  RNCM introduced self and explained reason for consult.  Mother and father of newborn both report that the last few days have been hard.  Things did not go as expected with the delivery and the baby is having to stay in the SCN.  They do endorse that the newborn is progressing well.  Mom says that she will probably be discharged tomorrow.  This is the couples first child.  RNCM will cont to follow newborn progress and check in with mom and dad.  Informed patient that postpartum depression information and Postpartum progress check list will be added to her discharge paperwork.    CSW Plan/Description:  Perinatal Mood and Anxiety Disorder (PMADs) Education, Psychosocial Support and Ongoing Assessment of Needs    Shelbie Hutching, RN 02/12/2022, 4:33 PM

## 2022-02-12 NOTE — Lactation Note (Signed)
This note was copied from a baby's chart. Lactation Consultation Note  Patient Name: Katherine Rivera Date: 02/12/2022   Age:27 hours  Lactation follow-up with parents at baby's bedside in SCN. Mom feeling better today, but discouraged with lack of output with pumping. She did see drops this morning when she pumped.  LC provided praise for her dedication to providing her baby her breast milk. LC also provided reassurance with the process of normal course of lactation, and possible delay of Lactogenesis II due to medical concerns post delivery. Mom had significant blood loss, delivered via c-section, and is 1st time parent- all which can affect the timing of milk production.  Encouraged mom to continue with pumping routine of every 3 hours during waking hours, and every 4 hours at night. Encouraged dad to assist with pumping/pump parts cleaning.  At this time planning to bond with baby through skin to skin efforts; no BF at this time.  Lavonia Drafts 02/12/2022, 11:30 AM

## 2022-02-13 ENCOUNTER — Other Ambulatory Visit: Payer: Self-pay

## 2022-02-13 LAB — CBC
HCT: 24.7 % — ABNORMAL LOW (ref 36.0–46.0)
Hemoglobin: 8.2 g/dL — ABNORMAL LOW (ref 12.0–15.0)
MCH: 29.8 pg (ref 26.0–34.0)
MCHC: 33.2 g/dL (ref 30.0–36.0)
MCV: 89.8 fL (ref 80.0–100.0)
Platelets: 218 10*3/uL (ref 150–400)
RBC: 2.75 MIL/uL — ABNORMAL LOW (ref 3.87–5.11)
RDW: 14.1 % (ref 11.5–15.5)
WBC: 10.2 10*3/uL (ref 4.0–10.5)
nRBC: 0 % (ref 0.0–0.2)

## 2022-02-13 MED ORDER — ACETAMINOPHEN 500 MG PO TABS: 1000.0000 mg | ORAL_TABLET | Freq: Four times a day (QID) | ORAL | 0 refills | Status: AC

## 2022-02-13 MED ORDER — COCONUT OIL OIL: 1.0000 | TOPICAL_OIL | 0 refills | Status: AC | PRN

## 2022-02-13 MED ORDER — IBUPROFEN 600 MG PO TABS
600.0000 mg | ORAL_TABLET | Freq: Four times a day (QID) | ORAL | Status: DC
  Administered 2022-02-13 (×2): 600 mg via ORAL
  Filled 2022-02-13 (×2): qty 1

## 2022-02-13 MED ORDER — WITCH HAZEL-GLYCERIN EX PADS
1.0000 | MEDICATED_PAD | CUTANEOUS | 12 refills | Status: AC | PRN
  Filled 2022-02-13: qty 40, fill #0

## 2022-02-13 MED ORDER — ACETAMINOPHEN 500 MG PO TABS
1000.0000 mg | ORAL_TABLET | Freq: Four times a day (QID) | ORAL | Status: DC
  Administered 2022-02-13 (×2): 1000 mg via ORAL
  Filled 2022-02-13 (×2): qty 2

## 2022-02-13 MED ORDER — SENNOSIDES-DOCUSATE SODIUM 8.6-50 MG PO TABS: 2.0000 | ORAL_TABLET | ORAL | Status: AC

## 2022-02-13 MED ORDER — FERROUS SULFATE 325 (65 FE) MG PO TABS: 325.0000 mg | ORAL_TABLET | Freq: Two times a day (BID) | ORAL | 3 refills | Status: AC

## 2022-02-13 MED ORDER — IBUPROFEN 600 MG PO TABS
600.0000 mg | ORAL_TABLET | Freq: Four times a day (QID) | ORAL | 0 refills | Status: AC
  Filled 2022-02-13: qty 30, 8d supply, fill #0

## 2022-02-13 MED ORDER — DOCUSATE SODIUM 100 MG PO CAPS: 100.0000 mg | ORAL_CAPSULE | Freq: Every day | ORAL | 0 refills | Status: AC

## 2022-02-13 MED ORDER — SIMETHICONE 80 MG PO CHEW: 80.0000 mg | CHEWABLE_TABLET | ORAL | 0 refills | Status: AC | PRN

## 2022-02-13 MED ORDER — OXYCODONE HCL 5 MG PO TABS
5.0000 mg | ORAL_TABLET | ORAL | 0 refills | Status: AC | PRN
  Filled 2022-02-13: qty 28, 5d supply, fill #0

## 2022-02-13 MED ORDER — DIBUCAINE (PERIANAL) 1 % EX OINT: 1.0000 | TOPICAL_OINTMENT | CUTANEOUS | Status: AC | PRN

## 2022-02-13 MED ORDER — ENOXAPARIN SODIUM 40 MG/0.4ML IJ SOSY
40.0000 mg | PREFILLED_SYRINGE | INTRAMUSCULAR | 0 refills | Status: AC
  Filled 2022-02-13: qty 8.4, 21d supply, fill #0

## 2022-02-13 NOTE — Progress Notes (Signed)
Pt has discharge order for today; RN reviewed all discharge instructions with pt and pt's husband; pt has already had someone pick up her prescription meds from the pharmacy; pt discharged via wheelchair escorted by [facility] to medical mall; pt going home with husband; baby in [facility]

## 2022-02-16 DIAGNOSIS — O165 Unspecified maternal hypertension, complicating the puerperium: Secondary | ICD-10-CM | POA: Diagnosis not present

## 2022-02-26 ENCOUNTER — Other Ambulatory Visit: Payer: Self-pay

## 2022-02-26 DIAGNOSIS — F53 Postpartum depression: Secondary | ICD-10-CM | POA: Diagnosis not present

## 2022-02-26 MED ORDER — SERTRALINE HCL 50 MG PO TABS
50.0000 mg | ORAL_TABLET | Freq: Every day | ORAL | 11 refills | Status: AC
  Filled 2022-02-26: qty 30, 30d supply, fill #0
  Filled 2022-03-23 – 2022-03-24 (×2): qty 30, 30d supply, fill #1
  Filled 2022-04-20: qty 30, 30d supply, fill #2

## 2022-03-01 DIAGNOSIS — Z3482 Encounter for supervision of other normal pregnancy, second trimester: Secondary | ICD-10-CM | POA: Diagnosis not present

## 2022-03-01 DIAGNOSIS — Z3483 Encounter for supervision of other normal pregnancy, third trimester: Secondary | ICD-10-CM | POA: Diagnosis not present

## 2022-03-17 DIAGNOSIS — Z1332 Encounter for screening for maternal depression: Secondary | ICD-10-CM | POA: Diagnosis not present

## 2022-03-24 ENCOUNTER — Other Ambulatory Visit (HOSPITAL_COMMUNITY): Payer: Self-pay

## 2022-03-24 ENCOUNTER — Other Ambulatory Visit: Payer: Self-pay

## 2022-04-10 ENCOUNTER — Telehealth: Payer: Self-pay

## 2022-04-10 NOTE — Telephone Encounter (Signed)
[facility]- Discharge Call Backs-Spoke with pt on the phone. 1-Do you have any questions or concerns about yourself as you heal?No 2-Any concerns or questions about your baby?No 3-Where does your baby sleep in your home? In the room with her. Review ABC's of safe sleep. 4-How was your stay at the hospital?Fine except for Anesthesia concern during C-Sec 5-How did our team work together to care for you?Great- [place] [facility] [place] in [place] were great. You should be receiving a survey in the mail soon.   We would really appreciate it if you could fill that out for [place] and return it in the mail.  We value the feedback to make improvements and continue the great work we do.   If you have any questions please feel free to call me back at [phone]

## 2022-04-15 DIAGNOSIS — Z30017 Encounter for initial prescription of implantable subdermal contraceptive: Secondary | ICD-10-CM | POA: Diagnosis not present

## 2022-04-15 DIAGNOSIS — Z01419 Encounter for gynecological examination (general) (routine) without abnormal findings: Secondary | ICD-10-CM | POA: Diagnosis not present

## 2022-04-15 DIAGNOSIS — Z01812 Encounter for preprocedural laboratory examination: Secondary | ICD-10-CM | POA: Diagnosis not present

## 2022-04-15 DIAGNOSIS — M7918 Myalgia, other site: Secondary | ICD-10-CM | POA: Diagnosis not present

## 2022-04-15 DIAGNOSIS — R87610 Atypical squamous cells of undetermined significance on cytologic smear of cervix (ASC-US): Secondary | ICD-10-CM | POA: Diagnosis not present

## 2022-04-15 DIAGNOSIS — R102 Pelvic and perineal pain: Secondary | ICD-10-CM | POA: Diagnosis not present

## 2022-04-15 DIAGNOSIS — Z124 Encounter for screening for malignant neoplasm of cervix: Secondary | ICD-10-CM | POA: Diagnosis not present

## 2022-04-20 ENCOUNTER — Other Ambulatory Visit: Payer: Self-pay

## 2022-05-10 DIAGNOSIS — Z3482 Encounter for supervision of other normal pregnancy, second trimester: Secondary | ICD-10-CM | POA: Diagnosis not present

## 2022-05-10 DIAGNOSIS — Z3483 Encounter for supervision of other normal pregnancy, third trimester: Secondary | ICD-10-CM | POA: Diagnosis not present

## 2022-05-21 NOTE — Progress Notes (Signed)
BP 122/74   Pulse 100   Temp 98 F (36.7 C) (Oral)   Resp 18   Ht 5\' 7"  (1.702 m)   Wt 290 lb 4.8 oz (131.7 kg)   SpO2 98%   BMI 45.47 kg/m    Subjective:    Patient ID: Katherine Rivera, female    DOB: December 28, 1995, 27 y.o.   MRN: [id number]  HPI: Katherine Rivera is a 27 y.o. female  Chief Complaint  Patient presents with   Hand Pain    Knot on thumb for over 1 year, is hard and can feel the tension   Epidermal inclusion cyst: patient reports she has had this hard knot on her thumb for over a year. She says it is not painful but she can feel the tightness and it has gotten bigger. She would like to have it removed.  Referral placed to ortho, for hand specialist.   Relevant past medical, surgical, family and social history reviewed and updated as indicated. Interim medical history since our last visit reviewed. Allergies and medications reviewed and updated.  Review of Systems  Constitutional: Negative for fever or weight change.  Respiratory: Negative for cough and shortness of breath.   Cardiovascular: Negative for chest pain or palpitations.  Gastrointestinal: Negative for abdominal pain, no bowel changes.  Musculoskeletal: Negative for gait problem or joint swelling.  Skin: Negative for rash.  Neurological: Negative for dizziness or headache.  No other specific complaints in a complete review of systems (except as listed in HPI above).      Objective:    BP 122/74   Pulse 100   Temp 98 F (36.7 C) (Oral)   Resp 18   Ht 5\' 7"  (1.702 m)   Wt 290 lb 4.8 oz (131.7 kg)   SpO2 98%   BMI 45.47 kg/m   Wt Readings from Last 3 Encounters:  05/22/22 290 lb 4.8 oz (131.7 kg)  02/08/22 (!) 311 lb 11.7 oz (141.4 kg)  01/23/22 (!) 304 lb 6.4 oz (138.1 kg)    Physical Exam  Constitutional: Patient appears well-developed and well-nourished. Obese  No distress.  HEENT: head atraumatic, normocephalic, pupils equal and reactive to light, neck supple, throat within  normal limits Cardiovascular: Normal rate, regular rhythm and normal heart sounds.  No murmur heard. No BLE edema. Pulmonary/Chest: Effort normal and breath sounds normal. No respiratory distress. Abdominal: Soft.  There is no tenderness. MSK: hard cyst on right thumb  Psychiatric: Patient has a normal mood and affect. behavior is normal. Judgment and thought content normal.  Results for orders placed or performed during the hospital encounter of 02/08/22  OB RESULT CONSOLE Group B Strep  Result Value Ref Range   GBS Negative   Protein / creatinine ratio, urine  Result Value Ref Range   Creatinine, Urine 201 mg/dL   Total Protein, Urine 34 mg/dL   Protein Creatinine Ratio 0.17 (H) 0.00 - 0.15 mg/mg[Cre]  Comprehensive metabolic panel  Result Value Ref Range   Sodium 137 135 - 145 mmol/L   Potassium 3.6 3.5 - 5.1 mmol/L   Chloride 106 98 - 111 mmol/L   CO2 22 22 - 32 mmol/L   Glucose, Bld 113 (H) 70 - 99 mg/dL   BUN 11 6 - 20 mg/dL   Creatinine, Ser 5.62 0.44 - 1.00 mg/dL   Calcium 8.9 8.9 - 13.0 mg/dL   Total Protein 7.0 6.5 - 8.1 g/dL   Albumin 2.8 (L) 3.5 - 5.0 g/dL  AST 20 15 - 41 U/L   ALT 14 0 - 44 U/L   Alkaline Phosphatase 82 38 - 126 U/L   Total Bilirubin 0.4 0.3 - 1.2 mg/dL   GFR, Estimated >40 >98 mL/min   Anion gap 9 5 - 15  CBC  Result Value Ref Range   WBC 11.1 (H) 4.0 - 10.5 K/uL   RBC 4.42 3.87 - 5.11 MIL/uL   Hemoglobin 13.2 12.0 - 15.0 g/dL   HCT 11.9 14.7 - 82.9 %   MCV 85.3 80.0 - 100.0 fL   MCH 29.9 26.0 - 34.0 pg   MCHC 35.0 30.0 - 36.0 g/dL   RDW 56.2 13.0 - 86.5 %   Platelets 193 150 - 400 K/uL   nRBC 0.0 0.0 - 0.2 %  RPR  Result Value Ref Range   RPR Ser Ql NON REACTIVE NON REACTIVE  CBC  Result Value Ref Range   WBC 10.5 4.0 - 10.5 K/uL   RBC 4.48 3.87 - 5.11 MIL/uL   Hemoglobin 13.3 12.0 - 15.0 g/dL   HCT 78.4 69.6 - 29.5 %   MCV 85.0 80.0 - 100.0 fL   MCH 29.7 26.0 - 34.0 pg   MCHC 34.9 30.0 - 36.0 g/dL   RDW 28.4 13.2 - 44.0 %    Platelets 181 150 - 400 K/uL   nRBC 0.0 0.0 - 0.2 %  Comprehensive metabolic panel  Result Value Ref Range   Sodium 137 135 - 145 mmol/L   Potassium 3.7 3.5 - 5.1 mmol/L   Chloride 106 98 - 111 mmol/L   CO2 20 (L) 22 - 32 mmol/L   Glucose, Bld 79 70 - 99 mg/dL   BUN 9 6 - 20 mg/dL   Creatinine, Ser 1.02 0.44 - 1.00 mg/dL   Calcium 9.0 8.9 - 72.5 mg/dL   Total Protein 6.8 6.5 - 8.1 g/dL   Albumin 2.8 (L) 3.5 - 5.0 g/dL   AST 24 15 - 41 U/L   ALT 14 0 - 44 U/L   Alkaline Phosphatase 78 38 - 126 U/L   Total Bilirubin 0.4 0.3 - 1.2 mg/dL   GFR, Estimated >36 >64 mL/min   Anion gap 11 5 - 15  CBC  Result Value Ref Range   WBC 19.9 (H) 4.0 - 10.5 K/uL   RBC 3.74 (L) 3.87 - 5.11 MIL/uL   Hemoglobin 11.2 (L) 12.0 - 15.0 g/dL   HCT 40.3 (L) 47.4 - 25.9 %   MCV 86.6 80.0 - 100.0 fL   MCH 29.9 26.0 - 34.0 pg   MCHC 34.6 30.0 - 36.0 g/dL   RDW 56.3 87.5 - 64.3 %   Platelets 191 150 - 400 K/uL   nRBC 0.0 0.0 - 0.2 %  Comprehensive metabolic panel  Result Value Ref Range   Sodium 133 (L) 135 - 145 mmol/L   Potassium 4.1 3.5 - 5.1 mmol/L   Chloride 104 98 - 111 mmol/L   CO2 18 (L) 22 - 32 mmol/L   Glucose, Bld 136 (H) 70 - 99 mg/dL   BUN 10 6 - 20 mg/dL   Creatinine, Ser 3.29 0.44 - 1.00 mg/dL   Calcium 8.1 (L) 8.9 - 10.3 mg/dL   Total Protein 5.7 (L) 6.5 - 8.1 g/dL   Albumin 2.3 (L) 3.5 - 5.0 g/dL   AST 29 15 - 41 U/L   ALT 13 0 - 44 U/L   Alkaline Phosphatase 72 38 - 126 U/L   Total Bilirubin  0.6 0.3 - 1.2 mg/dL   GFR, Estimated >16 >10 mL/min   Anion gap 11 5 - 15  CBC  Result Value Ref Range   WBC 13.7 (H) 4.0 - 10.5 K/uL   RBC 2.73 (L) 3.87 - 5.11 MIL/uL   Hemoglobin 8.2 (L) 12.0 - 15.0 g/dL   HCT 96.0 (L) 45.4 - 09.8 %   MCV 88.3 80.0 - 100.0 fL   MCH 30.0 26.0 - 34.0 pg   MCHC 34.0 30.0 - 36.0 g/dL   RDW 11.9 14.7 - 82.9 %   Platelets 159 150 - 400 K/uL   nRBC 0.0 0.0 - 0.2 %  Comprehensive metabolic panel  Result Value Ref Range   Sodium 135 135 - 145 mmol/L    Potassium 3.7 3.5 - 5.1 mmol/L   Chloride 107 98 - 111 mmol/L   CO2 22 22 - 32 mmol/L   Glucose, Bld 102 (H) 70 - 99 mg/dL   BUN 14 6 - 20 mg/dL   Creatinine, Ser 5.62 0.44 - 1.00 mg/dL   Calcium 7.5 (L) 8.9 - 10.3 mg/dL   Total Protein 5.1 (L) 6.5 - 8.1 g/dL   Albumin 2.1 (L) 3.5 - 5.0 g/dL   AST 19 15 - 41 U/L   ALT 10 0 - 44 U/L   Alkaline Phosphatase 55 38 - 126 U/L   Total Bilirubin 0.6 0.3 - 1.2 mg/dL   GFR, Estimated >13 >08 mL/min   Anion gap 6 5 - 15  CBC with Differential/Platelet  Result Value Ref Range   WBC 13.1 (H) 4.0 - 10.5 K/uL   RBC 2.78 (L) 3.87 - 5.11 MIL/uL   Hemoglobin 8.4 (L) 12.0 - 15.0 g/dL   HCT 65.7 (L) 84.6 - 96.2 %   MCV 88.8 80.0 - 100.0 fL   MCH 30.2 26.0 - 34.0 pg   MCHC 34.0 30.0 - 36.0 g/dL   RDW 95.2 84.1 - 32.4 %   Platelets 178 150 - 400 K/uL   nRBC 0.0 0.0 - 0.2 %   Neutrophils Relative % 84 %   Neutro Abs 10.9 (H) 1.7 - 7.7 K/uL   Lymphocytes Relative 11 %   Lymphs Abs 1.4 0.7 - 4.0 K/uL   Monocytes Relative 4 %   Monocytes Absolute 0.6 0.1 - 1.0 K/uL   Eosinophils Relative 1 %   Eosinophils Absolute 0.1 0.0 - 0.5 K/uL   Basophils Relative 0 %   Basophils Absolute 0.0 0.0 - 0.1 K/uL   Immature Granulocytes 0 %   Abs Immature Granulocytes 0.05 0.00 - 0.07 K/uL  CBC  Result Value Ref Range   WBC 10.2 4.0 - 10.5 K/uL   RBC 2.75 (L) 3.87 - 5.11 MIL/uL   Hemoglobin 8.2 (L) 12.0 - 15.0 g/dL   HCT 40.1 (L) 02.7 - 25.3 %   MCV 89.8 80.0 - 100.0 fL   MCH 29.8 26.0 - 34.0 pg   MCHC 33.2 30.0 - 36.0 g/dL   RDW 66.4 40.3 - 47.4 %   Platelets 218 150 - 400 K/uL   nRBC 0.0 0.0 - 0.2 %  Type and screen  Result Value Ref Range   ABO/RH(D) A POS    Antibody Screen NEG    Sample Expiration      02/11/2022,2359 Performed at [facility], [address] [place], [place] [address]   ABO/Rh  Result Value Ref Range   ABO/RH(D)      A POS Performed at [facility], [phone]  [address] [place], [place] [address]        Assessment & Plan:   Problem List Items Addressed This Visit   None Visit Diagnoses     Epidermal inclusion cyst    -  Primary   questionable epidermal inclusion cyst, referral placed to ortho for hand specialist   Relevant Orders   Ambulatory referral to Orthopedic Surgery        Follow up plan: Return if symptoms worsen or fail to improve.

## 2022-05-22 ENCOUNTER — Encounter: Payer: Self-pay | Admitting: Nurse Practitioner

## 2022-05-22 ENCOUNTER — Ambulatory Visit: Payer: Commercial Managed Care - PPO | Admitting: Nurse Practitioner

## 2022-05-22 VITALS — BP 122/74 | HR 100 | Temp 98.0°F | Resp 18 | Ht 67.0 in | Wt 290.3 lb

## 2022-05-22 DIAGNOSIS — Z3483 Encounter for supervision of other normal pregnancy, third trimester: Secondary | ICD-10-CM | POA: Diagnosis not present

## 2022-05-22 DIAGNOSIS — L720 Epidermal cyst: Secondary | ICD-10-CM | POA: Diagnosis not present

## 2022-05-22 DIAGNOSIS — Z3482 Encounter for supervision of other normal pregnancy, second trimester: Secondary | ICD-10-CM | POA: Diagnosis not present

## 2022-05-25 DIAGNOSIS — Z3483 Encounter for supervision of other normal pregnancy, third trimester: Secondary | ICD-10-CM | POA: Diagnosis not present

## 2022-05-25 DIAGNOSIS — Z3482 Encounter for supervision of other normal pregnancy, second trimester: Secondary | ICD-10-CM | POA: Diagnosis not present

## 2022-06-04 DIAGNOSIS — R2231 Localized swelling, mass and lump, right upper limb: Secondary | ICD-10-CM | POA: Diagnosis not present

## 2022-06-09 ENCOUNTER — Ambulatory Visit: Payer: 59 | Admitting: Physical Therapy

## 2022-06-12 ENCOUNTER — Ambulatory Visit: Payer: Commercial Managed Care - PPO | Admitting: Nurse Practitioner

## 2022-06-12 NOTE — Progress Notes (Deleted)
There were no vitals taken for this visit.   Subjective:    Patient ID: Katherine Rivera, female    DOB: Jun 20, 1995, 27 y.o.   MRN: [id number]  HPI: Katherine Rivera is a 27 y.o. female  No chief complaint on file.   Relevant past medical, surgical, family and social history reviewed and updated as indicated. Interim medical history since our last visit reviewed. Allergies and medications reviewed and updated.  Review of Systems  Constitutional: Negative for fever or weight change.  Respiratory: Negative for cough and shortness of breath.   Cardiovascular: Negative for chest pain or palpitations.  Gastrointestinal: Negative for abdominal pain, no bowel changes.  Musculoskeletal: Negative for gait problem or joint swelling.  Skin: Negative for rash.  Neurological: Negative for dizziness or headache.  No other specific complaints in a complete review of systems (except as listed in HPI above).      Objective:    There were no vitals taken for this visit.  Wt Readings from Last 3 Encounters:  05/22/22 290 lb 4.8 oz (131.7 kg)  02/08/22 (!) 311 lb 11.7 oz (141.4 kg)  01/23/22 (!) 304 lb 6.4 oz (138.1 kg)    Physical Exam  Constitutional: Patient appears well-developed and well-nourished. Obese *** No distress.  HEENT: head atraumatic, normocephalic, pupils equal and reactive to light, ears ***, neck supple, throat within normal limits Cardiovascular: Normal rate, regular rhythm and normal heart sounds.  No murmur heard. No BLE edema. Pulmonary/Chest: Effort normal and breath sounds normal. No respiratory distress. Abdominal: Soft.  There is no tenderness. Psychiatric: Patient has a normal mood and affect. behavior is normal. Judgment and thought content normal.  Results for orders placed or performed during the hospital encounter of 02/08/22  OB RESULT CONSOLE Group B Strep  Result Value Ref Range   GBS Negative   Protein / creatinine ratio, urine  Result Value Ref  Range   Creatinine, Urine 201 mg/dL   Total Protein, Urine 34 mg/dL   Protein Creatinine Ratio 0.17 (H) 0.00 - 0.15 mg/mg[Cre]  Comprehensive metabolic panel  Result Value Ref Range   Sodium 137 135 - 145 mmol/L   Potassium 3.6 3.5 - 5.1 mmol/L   Chloride 106 98 - 111 mmol/L   CO2 22 22 - 32 mmol/L   Glucose, Bld 113 (H) 70 - 99 mg/dL   BUN 11 6 - 20 mg/dL   Creatinine, Ser 4.09 0.44 - 1.00 mg/dL   Calcium 8.9 8.9 - 81.1 mg/dL   Total Protein 7.0 6.5 - 8.1 g/dL   Albumin 2.8 (L) 3.5 - 5.0 g/dL   AST 20 15 - 41 U/L   ALT 14 0 - 44 U/L   Alkaline Phosphatase 82 38 - 126 U/L   Total Bilirubin 0.4 0.3 - 1.2 mg/dL   GFR, Estimated >91 >47 mL/min   Anion gap 9 5 - 15  CBC  Result Value Ref Range   WBC 11.1 (H) 4.0 - 10.5 K/uL   RBC 4.42 3.87 - 5.11 MIL/uL   Hemoglobin 13.2 12.0 - 15.0 g/dL   HCT 82.9 56.2 - 13.0 %   MCV 85.3 80.0 - 100.0 fL   MCH 29.9 26.0 - 34.0 pg   MCHC 35.0 30.0 - 36.0 g/dL   RDW 86.5 78.4 - 69.6 %   Platelets 193 150 - 400 K/uL   nRBC 0.0 0.0 - 0.2 %  RPR  Result Value Ref Range   RPR Ser Ql NON REACTIVE NON  REACTIVE  CBC  Result Value Ref Range   WBC 10.5 4.0 - 10.5 K/uL   RBC 4.48 3.87 - 5.11 MIL/uL   Hemoglobin 13.3 12.0 - 15.0 g/dL   HCT 16.1 09.6 - 04.5 %   MCV 85.0 80.0 - 100.0 fL   MCH 29.7 26.0 - 34.0 pg   MCHC 34.9 30.0 - 36.0 g/dL   RDW 40.9 81.1 - 91.4 %   Platelets 181 150 - 400 K/uL   nRBC 0.0 0.0 - 0.2 %  Comprehensive metabolic panel  Result Value Ref Range   Sodium 137 135 - 145 mmol/L   Potassium 3.7 3.5 - 5.1 mmol/L   Chloride 106 98 - 111 mmol/L   CO2 20 (L) 22 - 32 mmol/L   Glucose, Bld 79 70 - 99 mg/dL   BUN 9 6 - 20 mg/dL   Creatinine, Ser 7.82 0.44 - 1.00 mg/dL   Calcium 9.0 8.9 - 95.6 mg/dL   Total Protein 6.8 6.5 - 8.1 g/dL   Albumin 2.8 (L) 3.5 - 5.0 g/dL   AST 24 15 - 41 U/L   ALT 14 0 - 44 U/L   Alkaline Phosphatase 78 38 - 126 U/L   Total Bilirubin 0.4 0.3 - 1.2 mg/dL   GFR, Estimated >21 >30 mL/min   Anion  gap 11 5 - 15  CBC  Result Value Ref Range   WBC 19.9 (H) 4.0 - 10.5 K/uL   RBC 3.74 (L) 3.87 - 5.11 MIL/uL   Hemoglobin 11.2 (L) 12.0 - 15.0 g/dL   HCT 86.5 (L) 78.4 - 69.6 %   MCV 86.6 80.0 - 100.0 fL   MCH 29.9 26.0 - 34.0 pg   MCHC 34.6 30.0 - 36.0 g/dL   RDW 29.5 28.4 - 13.2 %   Platelets 191 150 - 400 K/uL   nRBC 0.0 0.0 - 0.2 %  Comprehensive metabolic panel  Result Value Ref Range   Sodium 133 (L) 135 - 145 mmol/L   Potassium 4.1 3.5 - 5.1 mmol/L   Chloride 104 98 - 111 mmol/L   CO2 18 (L) 22 - 32 mmol/L   Glucose, Bld 136 (H) 70 - 99 mg/dL   BUN 10 6 - 20 mg/dL   Creatinine, Ser 4.40 0.44 - 1.00 mg/dL   Calcium 8.1 (L) 8.9 - 10.3 mg/dL   Total Protein 5.7 (L) 6.5 - 8.1 g/dL   Albumin 2.3 (L) 3.5 - 5.0 g/dL   AST 29 15 - 41 U/L   ALT 13 0 - 44 U/L   Alkaline Phosphatase 72 38 - 126 U/L   Total Bilirubin 0.6 0.3 - 1.2 mg/dL   GFR, Estimated >10 >27 mL/min   Anion gap 11 5 - 15  CBC  Result Value Ref Range   WBC 13.7 (H) 4.0 - 10.5 K/uL   RBC 2.73 (L) 3.87 - 5.11 MIL/uL   Hemoglobin 8.2 (L) 12.0 - 15.0 g/dL   HCT 25.3 (L) 66.4 - 40.3 %   MCV 88.3 80.0 - 100.0 fL   MCH 30.0 26.0 - 34.0 pg   MCHC 34.0 30.0 - 36.0 g/dL   RDW 47.4 25.9 - 56.3 %   Platelets 159 150 - 400 K/uL   nRBC 0.0 0.0 - 0.2 %  Comprehensive metabolic panel  Result Value Ref Range   Sodium 135 135 - 145 mmol/L   Potassium 3.7 3.5 - 5.1 mmol/L   Chloride 107 98 - 111 mmol/L   CO2 22 22 -  32 mmol/L   Glucose, Bld 102 (H) 70 - 99 mg/dL   BUN 14 6 - 20 mg/dL   Creatinine, Ser 1.61 0.44 - 1.00 mg/dL   Calcium 7.5 (L) 8.9 - 10.3 mg/dL   Total Protein 5.1 (L) 6.5 - 8.1 g/dL   Albumin 2.1 (L) 3.5 - 5.0 g/dL   AST 19 15 - 41 U/L   ALT 10 0 - 44 U/L   Alkaline Phosphatase 55 38 - 126 U/L   Total Bilirubin 0.6 0.3 - 1.2 mg/dL   GFR, Estimated >09 >60 mL/min   Anion gap 6 5 - 15  CBC with Differential/Platelet  Result Value Ref Range   WBC 13.1 (H) 4.0 - 10.5 K/uL   RBC 2.78 (L) 3.87 - 5.11  MIL/uL   Hemoglobin 8.4 (L) 12.0 - 15.0 g/dL   HCT 45.4 (L) 09.8 - 11.9 %   MCV 88.8 80.0 - 100.0 fL   MCH 30.2 26.0 - 34.0 pg   MCHC 34.0 30.0 - 36.0 g/dL   RDW 14.7 82.9 - 56.2 %   Platelets 178 150 - 400 K/uL   nRBC 0.0 0.0 - 0.2 %   Neutrophils Relative % 84 %   Neutro Abs 10.9 (H) 1.7 - 7.7 K/uL   Lymphocytes Relative 11 %   Lymphs Abs 1.4 0.7 - 4.0 K/uL   Monocytes Relative 4 %   Monocytes Absolute 0.6 0.1 - 1.0 K/uL   Eosinophils Relative 1 %   Eosinophils Absolute 0.1 0.0 - 0.5 K/uL   Basophils Relative 0 %   Basophils Absolute 0.0 0.0 - 0.1 K/uL   Immature Granulocytes 0 %   Abs Immature Granulocytes 0.05 0.00 - 0.07 K/uL  CBC  Result Value Ref Range   WBC 10.2 4.0 - 10.5 K/uL   RBC 2.75 (L) 3.87 - 5.11 MIL/uL   Hemoglobin 8.2 (L) 12.0 - 15.0 g/dL   HCT 13.0 (L) 86.5 - 78.4 %   MCV 89.8 80.0 - 100.0 fL   MCH 29.8 26.0 - 34.0 pg   MCHC 33.2 30.0 - 36.0 g/dL   RDW 69.6 29.5 - 28.4 %   Platelets 218 150 - 400 K/uL   nRBC 0.0 0.0 - 0.2 %  Type and screen  Result Value Ref Range   ABO/RH(D) A POS    Antibody Screen NEG    Sample Expiration      02/11/2022,2359 Performed at [facility], [address] [place], [place] [address]   ABO/Rh  Result Value Ref Range   ABO/RH(D)      A POS Performed at [facility], [address] [place], [place] [address]       Assessment & Plan:   Problem List Items Addressed This Visit   None    Follow up plan: No follow-ups on file.

## 2022-06-18 ENCOUNTER — Other Ambulatory Visit: Payer: Self-pay

## 2022-06-18 DIAGNOSIS — R2231 Localized swelling, mass and lump, right upper limb: Secondary | ICD-10-CM | POA: Diagnosis not present

## 2022-06-18 MED ORDER — SULFAMETHOXAZOLE-TRIMETHOPRIM 800-160 MG PO TABS
1.0000 | ORAL_TABLET | Freq: Two times a day (BID) | ORAL | 0 refills | Status: AC
  Filled 2022-06-18: qty 20, 10d supply, fill #0

## 2022-06-18 MED ORDER — TRAMADOL HCL 50 MG PO TABS
50.0000 mg | ORAL_TABLET | Freq: Four times a day (QID) | ORAL | 0 refills | Status: AC | PRN
  Filled 2022-06-18: qty 30, 8d supply, fill #0

## 2022-07-07 ENCOUNTER — Ambulatory Visit: Payer: Commercial Managed Care - PPO | Attending: Obstetrics and Gynecology

## 2022-07-07 ENCOUNTER — Other Ambulatory Visit: Payer: Self-pay

## 2022-07-07 DIAGNOSIS — R2689 Other abnormalities of gait and mobility: Secondary | ICD-10-CM | POA: Diagnosis not present

## 2022-07-07 DIAGNOSIS — R293 Abnormal posture: Secondary | ICD-10-CM | POA: Diagnosis not present

## 2022-07-07 DIAGNOSIS — R102 Pelvic and perineal pain: Secondary | ICD-10-CM | POA: Insufficient documentation

## 2022-07-07 DIAGNOSIS — M6281 Muscle weakness (generalized): Secondary | ICD-10-CM | POA: Insufficient documentation

## 2022-07-07 NOTE — Patient Instructions (Signed)
TOILET POSTURE: Urination: feet flat, lean forward with forearms on legs to fully empty bladder. Bowel movement: place feet flat on Squatty Potty or stool so knees are higher than hips, lean forward to relax pelvic floor in order to avoid strain.  SHOES: wear supportive shoes with a heel strap.  NO KEGELS!

## 2022-07-07 NOTE — Therapy (Signed)
OUTPATIENT PHYSICAL THERAPY FEMALE PELVIC EVALUATION   Patient Name: Katherine Rivera MRN: [id number] DOB:[birth year], 27 y.o., female Today's Date: 07/07/2022  END OF SESSION:  PT End of Session - 07/07/22 1156     Visit Number 1    Number of Visits 9    Date for PT Re-Evaluation 09/05/22    Authorization Type CHEP    Progress Note Due on Visit 10    PT Start Time 1146    PT Stop Time 1230    PT Time Calculation (min) 44 min    Activity Tolerance Patient tolerated treatment well    Behavior During Therapy [facility] for tasks assessed/performed             Past Medical History:  Diagnosis Date   Allergy    Elevated ferritin 03/15/2020   Elevated hemoglobin (HCC) 03/15/2020   Situs inversus    Vaccine for human papilloma virus (HPV) types 6, 11, 16, and 18 administered    Past Surgical History:  Procedure Laterality Date   CESAREAN SECTION  02/10/2022   Procedure: CESAREAN SECTION;  Surgeon: Christeen Douglas, MD;  Location: [facility] ORS;  Service: Obstetrics;;   TONSILLECTOMY AND ADENOIDECTOMY     WISDOM TOOTH EXTRACTION     Patient Active Problem List   Diagnosis Date Noted   Gestational hypertension 01/23/2022   Breech presentation, not applicable or unspecified fetus 01/06/2022   Family history of congenital heart defect 09/11/2021   Obesity affecting pregnancy in second trimester 09/11/2021   Supervision of high-risk pregnancy 09/09/2021   Supervision of high risk pregnancy in second trimester 09/09/2021   Vitamin D deficiency 03/15/2020   Posterior cervical lymphadenopathy- right  02/13/2020   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 Dalbert Garnet  REFERRING DIAG: Pelvic and perinal pain  THERAPY DIAG:  Pelvic pain  Other abnormalities of gait and mobility  Muscle weakness (generalized)  Abnormal posture  Rationale for Evaluation and Treatment: Rehabilitation  ONSET DATE: 05/01/22 referral date  SUBJECTIVE:                                                                                                                                                                                            SUBJECTIVE STATEMENT: URINARY FUNCTION: Pt reported urinary issues started after the c-section (02/10/22). Pt reported difficulty feeling when she needs to void. Pt reported she voids every once every 2-4 hours. Pt denied nocturia. Pt is an ICU nurse and works at night.  SEXUAL FUNCTION: Pt has hx of heavy period but no pain. Pt reported pelvic and perineal pain during speculum exam  in 04/2022 with Dr. Dalbert Garnet and very painful attempts during intercourse. It feels like she's being stabbed and lubrication is not an issues. Pt reported pain with regular tampon insertion. Pt reported climax is also very challenging. Pt rates pain 8/10 at worst, at best: 0/10.  BOWEL FUNCTION: denied pain with bowel movements (BM). Pt has one BM a day. CORE STABILITY: pt has hx of c-section 01/2022. Pt not currently performing scar mobility for c-section scar.  Pt reported second and third toe on L foot start to feel like they'll cramp postpartum but do not. No current workout plan.  Fluid intake: Yes: pt drinks about 80 oz. Of water a day, two shots of espresso when she works, 1-2 sodas a week. Two glasses of wine a week.    PAIN:  Are you having pain? No NPRS scale: 0/10 Pain location:  no pain right now   PRECAUTIONS: None  WEIGHT BEARING RESTRICTIONS: No  FALLS:  Has patient fallen in last 6 months? 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  PLOF: Independent  PATIENT GOALS: No pain with sex, tampon insertion or speculum exam.  PERTINENT HISTORY:  C-section 02/10/22 and latex allergy Sexual  abuse: No  BOWEL MOVEMENT: Pain with bowel movement: No Type of bowel movement:Frequency once a day Fully empty rectum: Yes:   Leakage: No Pads: No Fiber supplement: No  URINATION: Pain with urination: No Fully empty bladder: Yes: but had to rock at first postpartum Stream: Strong Urgency: Yes: but never has leakage Frequency: once every 2-4 hours Leakage:  none Pads: No  INTERCOURSE: Pain with intercourse: Initial Penetration, During Penetration, During Climax, and Pain Interrupts Intercourse Ability to have vaginal penetration:  Yes: but it's painful Climax: difficulty Marinoff Scale: 2-3/3  PREGNANCY: Number of pregnancies: 1 Vaginal deliveries : 0 Tearing No C-section deliveries 1 Currently pregnant No  PROLAPSE: None   OBJECTIVE:    COGNITION: Overall cognitive status: Within functional limits for tasks assessed     SENSATION: Light touch: Appears intact Proprioception: Appears intact  GAIT: Distance walked: 58' Assistive device utilized: None Level of assistance: Complete Independence Comments: wide BOS  POSTURE: increased lumbar lordosis, decreased thoracic kyphosis, and anterior pelvic tilt  PELVIC ALIGNMENT: ant. Pelvic tilt (incr. Postural sway with SLS balance)  LUMBARAROM/PROM:  A/PROM A/PROM  eval  Flexion WFL  Extension WFL  Right lateral flexion WFL  Left lateral flexion WFL  Right rotation WFL  Left rotation WFL   (Blank rows = not tested)  LOWER EXTREMITY ROM: all WFL except for limited B hip IR (5/10 pain L > R side)   Active ROM Right eval Left eval  Hip flexion    Hip extension    Hip abduction    Hip adduction    Hip internal rotation    Hip external rotation    Knee flexion    Knee extension    Ankle dorsiflexion    Ankle plantarflexion    Ankle inversion    Ankle eversion     LOWER EXTREMITY MMT:  MMT Right eval Left eval  Hip flexion 5/5 5/5  Hip extension    Hip abduction 3/5 3/5  Hip adduction 2/5  2/5  Hip internal rotation 4/5 5/5  Hip external rotation 5/5 5/5  Knee  flexion 5/5 5/5  Knee extension 5/5 5/5  Ankle dorsiflexion 5/5 5/5  Ankle plantarflexion    Ankle inversion    Ankle eversion     PALPATION:   General  TTP over L piriformis. PT palpated external PFM contraction with correct coordination noted but hypertonicity likely 2/2 palpation and subjective complaints. No diastasis noted.         PELVIC MMT:   MMT eval  Vaginal   Internal Anal Sphincter   External Anal Sphincter   Puborectalis   Diastasis Recti   (Blank rows = not tested)         TODAY'S TREATMENT:                                                                                                                              DATE: 07/07/22  EVAL    PATIENT EDUCATION:  Education details: PT educated pt on POC, duration, frequency. PT educated pt on IAP, PFM functions, the importance of proper toileting posture to fully empty bladder and reduce strain during BM. PT educated pt on proper footwear to decr. LE/foot tension which can impact PFM. Person educated: Patient Education method: Explanation, Demonstration, Verbal cues, and Handouts Education comprehension: verbalized understanding, returned demonstration, and needs further education  HOME EXERCISE PROGRAM: Not yet established.  ASSESSMENT:  CLINICAL IMPRESSION: Patient is a pleasant 27 y.o. female who was seen today for physical therapy evaluation and treatment for pelvic and perineal pain. Pt's PMH is significant for c-section 02/10/22 and latex allergy. The following impairments noted upon exam: postural dysfunction, hip weakness, decr. SLS balance, difficulty sensing urge to void until bladder very full, not performing scar mobilization, and decr. B hip IR ROM, pain during intercourse, tampon insertion and OBGYN exam. Pt would benefit from skilled PT to improve deficits listed above to safely perform ADLs and work duties.  OBJECTIVE  IMPAIRMENTS: Abnormal gait, decreased balance, decreased coordination, decreased endurance, decreased mobility, decreased ROM, decreased strength, hypomobility, increased fascial restrictions, increased muscle spasms, impaired flexibility, postural dysfunction, obesity, and pain.   ACTIVITY LIMITATIONS: standing, squatting, bed mobility, toileting, locomotion level, and caring for others  PARTICIPATION LIMITATIONS: cleaning, laundry, interpersonal relationship, and occupation  PERSONAL FACTORS: Fitness, Profession, and 1-2 comorbidities: see above.  are also affecting patient's functional outcome.   REHAB POTENTIAL: Good  CLINICAL DECISION MAKING: Stable/uncomplicated  EVALUATION COMPLEXITY: Low   GOALS: Goals reviewed with patient? Yes  SHORT TERM GOALS: Target date: for all STGs: 08/04/22  Pt will be IND in HEP to improve pain, strength, and coordination. Baseline: Goal status: INITIAL  2.  Pt will complete FOTO and PT will write goal as indicated. Baseline:  Goal status: INITIAL  3.  Pt will demo proper toileting posture to fully empty bladder. Baseline:  Goal status: INITIAL  4.  Pt will perform scar mobility to decr. Adhesions and improve ability to feel when she needs to boid. Baseline:  Goal status: INITIAL   LONG  TERM GOALS: Target date: 09/01/22  Pt will demonstrate improved B hip abd/add strength to 4/5 to decr. PFM tension and pain. Baseline:  Goal status: INITIAL  2.  Pt will demonstrate improved coordination of breath with PFM relaxation to decr. To </=2/10 Pain during tampon insertion, OBGYN exam and intercourse. Baseline: 8/10 at worst (75%of the time during above activities) Goal status: INITIAL  3.  Pt will report no toe cramping during activities to improve gait and work duties. Baseline:  Goal status: INITIAL  PLAN: meditation, B hip strengthening, child's pose, pelvic tilts.  PT FREQUENCY: 1x/week  PT DURATION: 8 weeks  PLANNED INTERVENTIONS:  Therapeutic exercises, Therapeutic activity, Neuromuscular re-education, Balance training, Gait training, Patient/Family education, Self Care, Joint mobilization, Joint manipulation, Spinal manipulation, Spinal mobilization, Moist heat, scar mobilization, Taping, Biofeedback, Manual therapy, and Re-evaluation  PLAN FOR NEXT SESSION: see above   Zarin Knupp L, PT 07/07/2022, 11:56 AM  Zerita Boers, PT,DPT 07/07/22 11:57 AM Phone: [phone] Fax: [phone]

## 2022-07-09 DIAGNOSIS — Z3482 Encounter for supervision of other normal pregnancy, second trimester: Secondary | ICD-10-CM | POA: Diagnosis not present

## 2022-07-09 DIAGNOSIS — Z3483 Encounter for supervision of other normal pregnancy, third trimester: Secondary | ICD-10-CM | POA: Diagnosis not present

## 2022-07-21 ENCOUNTER — Other Ambulatory Visit: Payer: Self-pay | Admitting: Nurse Practitioner

## 2022-07-21 DIAGNOSIS — N63 Unspecified lump in unspecified breast: Secondary | ICD-10-CM

## 2022-07-22 ENCOUNTER — Ambulatory Visit: Payer: 59

## 2022-07-24 ENCOUNTER — Ambulatory Visit
Admission: RE | Admit: 2022-07-24 | Discharge: 2022-07-24 | Disposition: A | Discharge status: Home / Self Care | Payer: Commercial Managed Care - PPO | Source: Ambulatory Visit | Attending: Nurse Practitioner | Admitting: Nurse Practitioner

## 2022-07-24 DIAGNOSIS — R928 Other abnormal and inconclusive findings on diagnostic imaging of breast: Secondary | ICD-10-CM | POA: Diagnosis not present

## 2022-07-24 DIAGNOSIS — N63 Unspecified lump in unspecified breast: Secondary | ICD-10-CM | POA: Diagnosis not present

## 2022-07-24 DIAGNOSIS — N6313 Unspecified lump in the right breast, lower outer quadrant: Secondary | ICD-10-CM | POA: Diagnosis not present

## 2022-07-29 ENCOUNTER — Other Ambulatory Visit: Payer: Self-pay

## 2022-07-29 ENCOUNTER — Ambulatory Visit: Payer: Commercial Managed Care - PPO | Attending: Obstetrics and Gynecology

## 2022-07-29 DIAGNOSIS — R102 Pelvic and perineal pain: Secondary | ICD-10-CM | POA: Diagnosis not present

## 2022-07-29 DIAGNOSIS — R2689 Other abnormalities of gait and mobility: Secondary | ICD-10-CM | POA: Diagnosis not present

## 2022-07-29 DIAGNOSIS — R293 Abnormal posture: Secondary | ICD-10-CM | POA: Insufficient documentation

## 2022-07-29 DIAGNOSIS — M6281 Muscle weakness (generalized): Secondary | ICD-10-CM | POA: Diagnosis not present

## 2022-07-29 NOTE — Patient Instructions (Signed)
PULLBACKS: Seated: gently pullback femur towards hip 5 reps x 5 second hold. Foot/leg will not move but it is an isometric contraction.

## 2022-07-29 NOTE — Therapy (Signed)
OUTPATIENT PHYSICAL THERAPY FEMALE PELVIC TREATMENT   Patient Name: Katherine Rivera MRN: [id number] DOB:July 18, 1995, 27 y.o., female Today's Date: 07/29/2022  END OF SESSION:  PT End of Session - 07/29/22 0935     Visit Number 2    Number of Visits 9    Date for PT Re-Evaluation 09/05/22    Authorization Type CHEP    Progress Note Due on Visit 10    PT Start Time 0933    PT Stop Time 1013    PT Time Calculation (min) 40 min    Activity Tolerance Patient tolerated treatment well    Behavior During Therapy [facility] for tasks assessed/performed             Past Medical History:  Diagnosis Date   Allergy    Elevated ferritin 03/15/2020   Elevated hemoglobin (HCC) 03/15/2020   Situs inversus    Vaccine for human papilloma virus (HPV) types 6, 11, 16, and 18 administered    Past Surgical History:  Procedure Laterality Date   CESAREAN SECTION  02/10/2022   Procedure: CESAREAN SECTION;  Surgeon: Christeen Douglas, MD;  Location: [facility] ORS;  Service: Obstetrics;;   TONSILLECTOMY AND ADENOIDECTOMY     WISDOM TOOTH EXTRACTION     Patient Active Problem List   Diagnosis Date Noted   Gestational hypertension 01/23/2022   Breech presentation, not applicable or unspecified fetus 01/06/2022   Family history of congenital heart defect 09/11/2021   Obesity affecting pregnancy in second trimester 09/11/2021   Supervision of high-risk pregnancy 09/09/2021   Supervision of high risk pregnancy in second trimester 09/09/2021   Vitamin D deficiency 03/15/2020   Posterior cervical lymphadenopathy- right  02/13/2020   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 Dalbert Garnet  REFERRING DIAG: Pelvic and perinal pain  THERAPY DIAG:  Pelvic pain  Other abnormalities of gait and mobility  Muscle weakness (generalized)  Abnormal posture  Rationale for Evaluation and Treatment: Rehabilitation  ONSET DATE: 05/01/22 referral date  SUBJECTIVE:                                                                                                                                                                                            SUBJECTIVE STATEMENT: Pt reported she's been having c-section scar pain when she txfs for STS and it starts midline to L side. Pt had to call out of work last night 2/2 pain. Pt has been performing toileting posture without issue. Pt attempted intercourse since last session and pain improved by approx. 50%. Urination issues are the same.    PAIN:  Are  you having pain? No NPRS scale: 0/10 but 6-7/10 when she stands up. Resolves approx. 5 minutes after walking. Longer she sits the worse the pain. Pain location:  no pain right now   PRECAUTIONS: None  WEIGHT BEARING RESTRICTIONS: No  FALLS:  Has patient fallen in last 6 months? 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  PLOF: Independent  PATIENT GOALS: No pain with sex, tampon insertion or speculum exam.  PERTINENT HISTORY:  C-section 02/10/22 and latex allergy Sexual abuse: No  BOWEL MOVEMENT: Pain with bowel movement: No Type of bowel movement:Frequency once a day Fully empty rectum: Yes:   Leakage: No Pads: No Fiber supplement: No  URINATION: Pain with urination: No Fully empty bladder: Yes: but had to rock at first postpartum Stream: Strong Urgency: Yes: but never has leakage Frequency: once every 2-4 hours Leakage:  none Pads: No  INTERCOURSE: Pain with intercourse: Initial Penetration, During Penetration, During Climax, and Pain Interrupts Intercourse Ability to have vaginal penetration:  Yes: but it's painful Climax: difficulty Marinoff Scale: 2-3/3  PREGNANCY: Number of pregnancies: 1 Vaginal  deliveries : 0 Tearing No C-section deliveries 1 Currently pregnant No  PROLAPSE: None   OBJECTIVE:    COGNITION: Overall cognitive status: Within functional limits for tasks assessed     SENSATION: Light touch: Appears intact Proprioception: Appears intact  GAIT: Distance walked: 61' Assistive device utilized: None Level of assistance: Complete Independence Comments: wide BOS, antalgic  From eval but kept for reference: POSTURE: increased lumbar lordosis, decreased thoracic kyphosis, and anterior pelvic tilt  PELVIC ALIGNMENT: ant. Pelvic tilt (incr. Postural sway with SLS balance)  LUMBARAROM/PROM:  A/PROM A/PROM  eval  Flexion WFL  Extension WFL  Right lateral flexion WFL  Left lateral flexion WFL  Right rotation WFL  Left rotation WFL   (Blank rows = not tested)  LOWER EXTREMITY ROM: all WFL except for limited B hip IR (5/10 pain L > R side)   Active ROM Right eval Left eval  Hip flexion    Hip extension    Hip abduction    Hip adduction    Hip internal rotation    Hip external rotation    Knee flexion    Knee extension    Ankle dorsiflexion    Ankle plantarflexion    Ankle inversion    Ankle eversion     LOWER EXTREMITY MMT:  MMT Right eval Left eval  Hip flexion 5/5 5/5  Hip extension    Hip abduction 3/5 3/5  Hip adduction 2/5 2/5  Hip internal rotation 4/5 5/5  Hip external rotation 5/5 5/5  Knee flexion 5/5 5/5  Knee extension 5/5 5/5  Ankle dorsiflexion 5/5 5/5  Ankle plantarflexion    Ankle inversion    Ankle eversion     PALPATION:         PELVIC MMT:   MMT eval  Vaginal   Internal Anal Sphincter   External Anal Sphincter   Puborectalis   Diastasis Recti   (Blank rows = not tested)         TODAY'S TREATMENT:  DATE: 07/29/22   MANUAL THERAPY: Pain with scar mobilization approx. 3-4/10. PT  performed and then educated pt on how to perform scar mobilization (//, perpendicular, circular and approximation) to decr. Pain, N/T, and bladder proprioception.    NMR: Access Code: [email or web address] URL: [email or web address] Date: 07/29/2022 Prepared by: Zerita Boers  Exercises - Child's Pose Stretch  - 1 x daily - 7 x weekly - 1 sets - 3 reps - 30-60 hold and with arms to the right side to stretch L side. -Standing tx spine ext with breathwork (not added to HEP) -Pullbacks: seated femur to hip isometric contraction to move femur in neutral of acetabulum vs. Ant. Positioning to improve B hip IR and decr. Pain. - Supine Diaphragmatic Breathing  - 1 x daily - 7 x weekly - 1 sets - 5 reps Cues and demo for technique. Pt reported decr. Pain during STS (5/10) at endo of session.  Patient Education - Scar Massage  PATIENT EDUCATION:  Education details: PT educated on new HEP and scar mobilizaiton. Person educated: Patient Education method: Explanation, Demonstration, Verbal cues, and Handouts Education comprehension: verbalized understanding, returned demonstration, and needs further education  HOME EXERCISE PROGRAM: [email or web address]  ASSESSMENT:  CLINICAL IMPRESSION: Skilled session focused on scar mobilization to decr. N/T, pain, and bladder proprioception and establishing HEP to decr. Pain, improve breathing coordination. Pt demonstrated progress as she required less cues at end of session and less pain 5/10 vs. 6-7/10 during STS. The following impairments noted upon exam: postural dysfunction, hip weakness, decr. SLS balance, difficulty sensing urge to void until bladder very full, not performing scar mobilization, and decr. B hip IR ROM, pain during intercourse, tampon insertion and OBGYN exam. Pt would continue benefit from skilled PT to improve deficits listed above to safely perform ADLs and work duties.  OBJECTIVE IMPAIRMENTS: Abnormal gait, decreased balance, decreased  coordination, decreased endurance, decreased mobility, decreased ROM, decreased strength, hypomobility, increased fascial restrictions, increased muscle spasms, impaired flexibility, postural dysfunction, obesity, and pain.   ACTIVITY LIMITATIONS: standing, squatting, bed mobility, toileting, locomotion level, and caring for others  PARTICIPATION LIMITATIONS: cleaning, laundry, interpersonal relationship, and occupation  PERSONAL FACTORS: Fitness, Profession, and 1-2 comorbidities: see above.  are also affecting patient's functional outcome.   REHAB POTENTIAL: Good  CLINICAL DECISION MAKING: Stable/uncomplicated  EVALUATION COMPLEXITY: Low   GOALS: Goals reviewed with patient? Yes  SHORT TERM GOALS: Target date: for all STGs: 08/04/22  Pt will be IND in HEP to improve pain, strength, and coordination. Baseline: Goal status: INITIAL  2.  Pt will complete FOTO and PT will write goal as indicated. Baseline:  Goal status: INITIAL  3.  Pt will demo proper toileting posture to fully empty bladder. Baseline:  Goal status: INITIAL  4.  Pt will perform scar mobility to decr. Adhesions and improve ability to feel when she needs to boid. Baseline:  Goal status: INITIAL   LONG TERM GOALS: Target date: 09/01/22  Pt will demonstrate improved B hip abd/add strength to 4/5 to decr. PFM tension and pain. Baseline:  Goal status: INITIAL  2.  Pt will demonstrate improved coordination of breath with PFM relaxation to decr. To </=2/10 Pain during tampon insertion, OBGYN exam and intercourse. Baseline: 8/10 at worst (75%of the time during above activities) Goal status: INITIAL  3.  Pt will report no toe cramping during activities to improve gait and work duties. Baseline:  Goal status: INITIAL  PLAN: review HEP prn, meditation, B hip strengthening, pelvic tilts.  PT FREQUENCY: 1x/week  PT DURATION: 8 weeks  PLANNED INTERVENTIONS: Therapeutic exercises, Therapeutic activity,  Neuromuscular re-education, Balance training, Gait training, Patient/Family education, Self Care, Joint mobilization, Joint manipulation, Spinal manipulation, Spinal mobilization, Moist heat, scar mobilization, Taping, Biofeedback, Manual therapy, and Re-evaluation  PLAN FOR NEXT SESSION: see above   Ia Leeb L, PT 07/29/2022, 9:36 AM  Zerita Boers, PT,DPT 07/29/22 9:36 AM Phone: [phone] Fax: [phone]

## 2022-09-02 ENCOUNTER — Ambulatory Visit: Payer: Commercial Managed Care - PPO | Attending: Obstetrics and Gynecology

## 2022-09-02 DIAGNOSIS — M6281 Muscle weakness (generalized): Secondary | ICD-10-CM | POA: Insufficient documentation

## 2022-09-02 DIAGNOSIS — R102 Pelvic and perineal pain: Secondary | ICD-10-CM | POA: Insufficient documentation

## 2022-09-02 DIAGNOSIS — R293 Abnormal posture: Secondary | ICD-10-CM | POA: Insufficient documentation

## 2022-09-02 DIAGNOSIS — R2689 Other abnormalities of gait and mobility: Secondary | ICD-10-CM | POA: Insufficient documentation

## 2022-09-16 ENCOUNTER — Ambulatory Visit: Payer: Commercial Managed Care - PPO

## 2022-09-16 ENCOUNTER — Other Ambulatory Visit: Payer: Self-pay

## 2022-09-16 DIAGNOSIS — R293 Abnormal posture: Secondary | ICD-10-CM | POA: Diagnosis not present

## 2022-09-16 DIAGNOSIS — R2689 Other abnormalities of gait and mobility: Secondary | ICD-10-CM

## 2022-09-16 DIAGNOSIS — M6281 Muscle weakness (generalized): Secondary | ICD-10-CM | POA: Diagnosis not present

## 2022-09-16 DIAGNOSIS — R102 Pelvic and perineal pain: Secondary | ICD-10-CM

## 2022-09-16 NOTE — Therapy (Signed)
OUTPATIENT PHYSICAL THERAPY FEMALE PELVIC TREATMENT/recert   Patient Name: Katherine Rivera MRN: [id number] DOB:Jan 09, 1996, 27 y.o., female Today's Date: 09/16/2022  END OF SESSION:  PT End of Session - 09/16/22 0935     Visit Number 3    Number of Visits 9    Date for PT Re-Evaluation 09/05/22    Authorization Type CHEP    Progress Note Due on Visit 10    PT Start Time 0932    PT Stop Time 1013    PT Time Calculation (min) 41 min    Activity Tolerance Patient tolerated treatment well    Behavior During Therapy [facility] for tasks assessed/performed             Past Medical History:  Diagnosis Date   Allergy    Elevated ferritin 03/15/2020   Elevated hemoglobin (HCC) 03/15/2020   Situs inversus    Vaccine for human papilloma virus (HPV) types 6, 11, 16, and 18 administered    Past Surgical History:  Procedure Laterality Date   CESAREAN SECTION  02/10/2022   Procedure: CESAREAN SECTION;  Surgeon: Christeen Douglas, MD;  Location: [facility] ORS;  Service: Obstetrics;;   TONSILLECTOMY AND ADENOIDECTOMY     WISDOM TOOTH EXTRACTION     Patient Active Problem List   Diagnosis Date Noted   Gestational hypertension 01/23/2022   Breech presentation, not applicable or unspecified fetus 01/06/2022   Family history of congenital heart defect 09/11/2021   Obesity affecting pregnancy in second trimester 09/11/2021   Supervision of high-risk pregnancy 09/09/2021   Supervision of high risk pregnancy in second trimester 09/09/2021   Vitamin D deficiency 03/15/2020   Posterior cervical lymphadenopathy- right  02/13/2020   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 Dalbert Garnet  REFERRING DIAG: Pelvic and perinal pain  THERAPY DIAG:  Pelvic pain  Other abnormalities of gait and mobility  Muscle weakness (generalized)  Abnormal posture  Rationale for Evaluation and Treatment: Rehabilitation  ONSET DATE: 05/01/22 referral date  SUBJECTIVE:                                                                                                                                                                                            SUBJECTIVE STATEMENT: Pt reported intercourse is much improved, about 90% improved. No hip pain. Pt performing HEP but not child's pose 2/2 hard to work in.  Pt reported less discomfort around scar during scar mobilization.  PAIN:  Are you having pain? No 09/16/22 NPRS scale: 0/10 from last session:  but 6-7/10 when she stands up. Resolves approx. 5 minutes after walking.  Longer she sits the worse the pain. Pain location:  no pain right now   PRECAUTIONS: None  WEIGHT BEARING RESTRICTIONS: No  FALLS:  Has patient fallen in last 6 months? 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  PLOF: Independent  PATIENT GOALS: No pain with sex, tampon insertion or speculum exam.  PERTINENT HISTORY:  C-section 02/10/22 and latex allergy Sexual abuse: No  BOWEL MOVEMENT: Pain with bowel movement: No Type of bowel movement:Frequency once a day Fully empty rectum: Yes:   Leakage: No Pads: No Fiber supplement: No  URINATION: Pain with urination: No Fully empty bladder: Yes: but had to rock at first postpartum Stream: Strong Urgency: Yes: but never has leakage Frequency: once every 2-4 hours Leakage:  none Pads: No  INTERCOURSE: Pain with intercourse: Initial Penetration, During Penetration, During Climax, and Pain Interrupts Intercourse Ability to have vaginal penetration:  Yes: but it's painful Climax: difficulty Marinoff Scale: 2-3/3  PREGNANCY: Number of pregnancies: 1 Vaginal deliveries : 0 Tearing No C-section deliveries 1 Currently pregnant  No  PROLAPSE: None   OBJECTIVE:    COGNITION: Overall cognitive status: Within functional limits for tasks assessed     SENSATION: Light touch: Appears intact Proprioception: Appears intact  GAIT: Distance walked: 85' Assistive device utilized: None Level of assistance: Complete Independence Comments: wide BOS, antalgic  From eval but kept for reference: POSTURE: increased lumbar lordosis, decreased thoracic kyphosis, and anterior pelvic tilt  PELVIC ALIGNMENT: ant. Pelvic tilt (incr. Postural sway with SLS balance)  LUMBARAROM/PROM:  A/PROM A/PROM  eval  Flexion WFL  Extension WFL  Right lateral flexion WFL  Left lateral flexion WFL  Right rotation WFL  Left rotation WFL   (Blank rows = not tested)  LOWER EXTREMITY ROM: all WFL except for limited B hip IR (5/10 pain L > R side)   Active ROM Right eval Left eval  Hip flexion    Hip extension    Hip abduction    Hip adduction    Hip internal rotation    Hip external rotation    Knee flexion    Knee extension    Ankle dorsiflexion    Ankle plantarflexion    Ankle inversion    Ankle eversion     LOWER EXTREMITY MMT: 8/28: B hip abd: 3+/5, B hip add: 3+/5  MMT Right eval Left eval  Hip flexion 5/5 5/5  Hip extension    Hip abduction 3/5 3/5  Hip adduction 2/5 2/5  Hip internal rotation 4/5 5/5  Hip external rotation 5/5 5/5  Knee flexion 5/5 5/5  Knee extension 5/5 5/5  Ankle dorsiflexion 5/5 5/5  Ankle plantarflexion    Ankle inversion    Ankle eversion     PALPATION:         PELVIC MMT:   MMT eval  Vaginal   Internal Anal Sphincter   External Anal Sphincter   Puborectalis   Diastasis Recti   (Blank rows = not tested)         TODAY'S TREATMENT:  DATE: 09/16/22    NMR: Access Code: [email or web address] URL: [email or web address] Date:  07/29/2022 Prepared by: Zerita Boers  Exercises - Child's Pose Stretch  - 1 x daily - 7 x weekly - 1 sets - 3 reps - 30-60 hold and with arms to the right side to stretch L side. -Standing tx spine ext with breathwork (not added to HEP) -Pullbacks: seated femur to hip isometric contraction to move femur in neutral of acetabulum vs. Ant. Positioning to improve B hip IR and decr. Pain. - Supine Diaphragmatic Breathing  - 1 x daily - 7 x weekly - 1 sets - 5 reps (IN SEATED), cues for lat/post rib expansion vs. Chest. Performed with S for safety. Did not perform child's pose as pt not performing 2/2 time constraints.   FOTO: 33 PFDI pain, urinary not applicable per pt.  PATIENT EDUCATION:  Education details: PT reviewed goals with pt and discussed potential d/c next session based on progress. Person educated: Patient Education method: Explanation, Demonstration, Verbal cues, and Handouts Education comprehension: verbalized understanding, returned demonstration, and needs further education  HOME EXERCISE PROGRAM: [email or web address]  ASSESSMENT:  CLINICAL IMPRESSION: Pt demonstrated progress as she met all STGs, she also reported 90% improvement in pain during intercourse on GROC scale. Pt's hip pain has also resolved. Pt's FOTO pelvic pain score (answered as if it was before PT) indicated: mild-mod impact on QOL. The following impairments continue: postural dysfunction, hip weakness, decr. SLS balance, and decr. B hip IR ROM, pain during intercourse, tampon insertion and OBGYN exam. Pt would continue benefit from skilled PT to improve deficits listed above to safely perform ADLs and work duties. Pt's missed appointments, therefore, PT extending to one more visit based on goal progress.  OBJECTIVE IMPAIRMENTS: Abnormal gait, decreased balance, decreased coordination, decreased endurance, decreased mobility, decreased ROM, decreased strength, hypomobility, increased fascial restrictions, increased  muscle spasms, impaired flexibility, postural dysfunction, obesity, and pain.   ACTIVITY LIMITATIONS: standing, squatting, bed mobility, toileting, locomotion level, and caring for others  PARTICIPATION LIMITATIONS: cleaning, laundry, interpersonal relationship, and occupation  PERSONAL FACTORS: Fitness, Profession, and 1-2 comorbidities: see above.  are also affecting patient's functional outcome.   REHAB POTENTIAL: Good  CLINICAL DECISION MAKING: Stable/uncomplicated  EVALUATION COMPLEXITY: Low   GOALS: Goals reviewed with patient? Yes  SHORT TERM GOALS: Target date: for all STGs: 08/04/22  Pt will be IND in HEP to improve pain, strength, and coordination. Baseline: Goal status: MET  2.  Pt will complete FOTO and PT will write goal as indicated. Baseline:  Goal status: MET  3.  Pt will demo proper toileting posture to fully empty bladder. Baseline:  Goal status: MET  4.  Pt will perform scar mobility to decr. Adhesions and improve ability to feel when she needs to boid. Baseline:  Goal status: MET   LONG TERM GOALS: Target date: 09/01/22, NEW LTGS DATE: 10/14/22  Pt will demonstrate improved B hip abd/add strength to 4/5 to decr. PFM tension and pain. Baseline:  Goal status: INITIAL  2.  Pt will demonstrate improved coordination of breath with PFM relaxation to decr. To </=2/10 Pain during tampon insertion, OBGYN exam and intercourse. Baseline: 8/10 at worst (75%of the time during above activities), 8/28: 2/10 Goal status: INITIAL  3.  Pt will report no toe cramping during activities to improve gait and work duties. Baseline: 8/28 has not noticed over the last two weeks. Goal status: MET   4. Pt's PFDI pain score will improve to </=23 to improve QOL.  Baseline: 33  Goal status: INITIAL  PLAN:d/c, meditation, B hip strengthening (less focus on clams) progress  PT FREQUENCY: 1x/week  PT DURATION: 8 weeks  PLANNED INTERVENTIONS: Therapeutic exercises,  Therapeutic activity, Neuromuscular re-education, Balance training, Gait training, Patient/Family education, Self Care, Joint mobilization, Joint manipulation, Spinal manipulation, Spinal mobilization, Moist heat, scar mobilization, Taping, Biofeedback, Manual therapy, and Re-evaluation  PLAN FOR NEXT SESSION: see above   Pragya Lofaso L, PT 09/16/2022, 9:35 AM  Zerita Boers, PT,DPT 09/16/22 9:35 AM Phone: [phone] Fax: [phone]

## 2022-11-12 ENCOUNTER — Other Ambulatory Visit: Payer: Self-pay

## 2022-11-12 MED ORDER — NORGESTIMATE-ETH ESTRADIOL 0.25-35 MG-MCG PO TABS
1.0000 | ORAL_TABLET | Freq: Every day | ORAL | 0 refills | Status: AC
  Filled 2022-11-12: qty 84, 84d supply, fill #0

## 2023-08-25 ENCOUNTER — Other Ambulatory Visit: Payer: Self-pay | Admitting: Nurse Practitioner

## 2023-08-25 DIAGNOSIS — N63 Unspecified lump in unspecified breast: Secondary | ICD-10-CM

## 2023-08-30 ENCOUNTER — Ambulatory Visit
Admission: RE | Admit: 2023-08-30 | Discharge: 2023-08-30 | Disposition: A | Discharge status: Home / Self Care | Source: Ambulatory Visit | Attending: Nurse Practitioner | Admitting: Nurse Practitioner

## 2023-08-30 DIAGNOSIS — N6310 Unspecified lump in the right breast, unspecified quadrant: Secondary | ICD-10-CM | POA: Diagnosis not present

## 2023-08-30 DIAGNOSIS — N63 Unspecified lump in unspecified breast: Secondary | ICD-10-CM | POA: Insufficient documentation
